# Patient Record
Sex: Male | Born: 1970 | Hispanic: No | Marital: Single | Smoking: Former smoker
Health system: Southern US, Community
[De-identification: ages and names within clinical notes are randomized; demographics above are authoritative.]

## PROBLEM LIST (undated history)

## (undated) DIAGNOSIS — E7800 Pure hypercholesterolemia, unspecified: Secondary | ICD-10-CM

## (undated) DIAGNOSIS — F32A Depression, unspecified: Secondary | ICD-10-CM

## (undated) DIAGNOSIS — K219 Gastro-esophageal reflux disease without esophagitis: Secondary | ICD-10-CM

## (undated) DIAGNOSIS — I10 Essential (primary) hypertension: Secondary | ICD-10-CM

## (undated) DIAGNOSIS — R51 Headache: Secondary | ICD-10-CM

## (undated) DIAGNOSIS — T7840XA Allergy, unspecified, initial encounter: Secondary | ICD-10-CM

## (undated) DIAGNOSIS — F329 Major depressive disorder, single episode, unspecified: Secondary | ICD-10-CM

## (undated) DIAGNOSIS — R519 Headache, unspecified: Secondary | ICD-10-CM

## (undated) DIAGNOSIS — F419 Anxiety disorder, unspecified: Secondary | ICD-10-CM

## (undated) DIAGNOSIS — F209 Schizophrenia, unspecified: Secondary | ICD-10-CM

## (undated) DIAGNOSIS — N200 Calculus of kidney: Secondary | ICD-10-CM

## (undated) DIAGNOSIS — E119 Type 2 diabetes mellitus without complications: Secondary | ICD-10-CM

## (undated) HISTORY — PX: CYSTOSCOPY W/ URETEROSCOPY W/ LITHOTRIPSY: SUR380

## (undated) HISTORY — PX: CIRCUMCISION: SUR203

## (undated) HISTORY — DX: Gastro-esophageal reflux disease without esophagitis: K21.9

## (undated) HISTORY — DX: Anxiety disorder, unspecified: F41.9

## (undated) HISTORY — DX: Allergy, unspecified, initial encounter: T78.40XA

---

## 2006-01-01 ENCOUNTER — Emergency Department (HOSPITAL_COMMUNITY): Admission: EM | Admit: 2006-01-01 | Discharge: 2006-01-01 | Payer: Self-pay | Admitting: Emergency Medicine

## 2006-01-08 ENCOUNTER — Emergency Department (HOSPITAL_COMMUNITY): Admission: EM | Admit: 2006-01-08 | Discharge: 2006-01-09 | Payer: Self-pay | Admitting: Emergency Medicine

## 2006-01-16 ENCOUNTER — Inpatient Hospital Stay (HOSPITAL_COMMUNITY): Admission: EM | Admit: 2006-01-16 | Discharge: 2006-01-19 | Payer: Self-pay | Admitting: Emergency Medicine

## 2006-02-03 ENCOUNTER — Emergency Department (HOSPITAL_COMMUNITY): Admission: EM | Admit: 2006-02-03 | Discharge: 2006-02-03 | Payer: Self-pay | Admitting: Family Medicine

## 2006-02-23 ENCOUNTER — Emergency Department (HOSPITAL_COMMUNITY): Admission: EM | Admit: 2006-02-23 | Discharge: 2006-02-23 | Payer: Self-pay | Admitting: Emergency Medicine

## 2006-04-12 ENCOUNTER — Emergency Department (HOSPITAL_COMMUNITY): Admission: EM | Admit: 2006-04-12 | Discharge: 2006-04-12 | Payer: Self-pay | Admitting: Emergency Medicine

## 2006-09-11 ENCOUNTER — Emergency Department (HOSPITAL_COMMUNITY): Admission: EM | Admit: 2006-09-11 | Discharge: 2006-09-11 | Payer: Self-pay | Admitting: Emergency Medicine

## 2006-09-30 ENCOUNTER — Ambulatory Visit: Payer: Self-pay | Admitting: Family Medicine

## 2006-09-30 ENCOUNTER — Inpatient Hospital Stay (HOSPITAL_COMMUNITY): Admission: EM | Admit: 2006-09-30 | Discharge: 2006-10-01 | Payer: Self-pay | Admitting: Emergency Medicine

## 2006-11-24 ENCOUNTER — Emergency Department (HOSPITAL_COMMUNITY): Admission: EM | Admit: 2006-11-24 | Discharge: 2006-11-24 | Payer: Self-pay | Admitting: Emergency Medicine

## 2008-05-24 ENCOUNTER — Inpatient Hospital Stay (HOSPITAL_COMMUNITY): Admission: EM | Admit: 2008-05-24 | Discharge: 2008-05-28 | Payer: Self-pay | Admitting: Emergency Medicine

## 2008-05-24 ENCOUNTER — Encounter (INDEPENDENT_AMBULATORY_CARE_PROVIDER_SITE_OTHER): Payer: Self-pay | Admitting: General Surgery

## 2008-08-30 ENCOUNTER — Inpatient Hospital Stay (HOSPITAL_COMMUNITY): Admission: EM | Admit: 2008-08-30 | Discharge: 2008-09-03 | Payer: Self-pay | Admitting: Emergency Medicine

## 2008-09-02 ENCOUNTER — Ambulatory Visit: Payer: Self-pay | Admitting: *Deleted

## 2008-09-03 ENCOUNTER — Inpatient Hospital Stay (HOSPITAL_COMMUNITY): Admission: RE | Admit: 2008-09-03 | Discharge: 2008-09-06 | Payer: Self-pay | Admitting: *Deleted

## 2008-09-20 ENCOUNTER — Emergency Department (HOSPITAL_COMMUNITY): Admission: EM | Admit: 2008-09-20 | Discharge: 2008-09-20 | Payer: Self-pay | Admitting: Emergency Medicine

## 2008-11-08 ENCOUNTER — Emergency Department (HOSPITAL_COMMUNITY): Admission: EM | Admit: 2008-11-08 | Discharge: 2008-11-08 | Payer: Self-pay | Admitting: Family Medicine

## 2008-11-12 ENCOUNTER — Emergency Department (HOSPITAL_COMMUNITY): Admission: EM | Admit: 2008-11-12 | Discharge: 2008-11-12 | Payer: Self-pay | Admitting: Emergency Medicine

## 2008-11-23 ENCOUNTER — Other Ambulatory Visit: Payer: Self-pay

## 2008-11-23 ENCOUNTER — Other Ambulatory Visit: Payer: Self-pay | Admitting: Emergency Medicine

## 2008-11-23 ENCOUNTER — Inpatient Hospital Stay (HOSPITAL_COMMUNITY): Admission: RE | Admit: 2008-11-23 | Discharge: 2008-11-26 | Payer: Self-pay | Admitting: *Deleted

## 2008-11-23 ENCOUNTER — Ambulatory Visit: Payer: Self-pay | Admitting: *Deleted

## 2009-03-10 ENCOUNTER — Emergency Department (HOSPITAL_COMMUNITY): Admission: EM | Admit: 2009-03-10 | Discharge: 2009-03-11 | Payer: Self-pay | Admitting: Emergency Medicine

## 2009-05-12 ENCOUNTER — Emergency Department (HOSPITAL_COMMUNITY): Admission: EM | Admit: 2009-05-12 | Discharge: 2009-05-12 | Payer: Self-pay | Admitting: Emergency Medicine

## 2009-10-19 ENCOUNTER — Emergency Department (HOSPITAL_COMMUNITY): Admission: EM | Admit: 2009-10-19 | Discharge: 2009-10-19 | Payer: Self-pay | Admitting: Emergency Medicine

## 2009-12-16 ENCOUNTER — Emergency Department (HOSPITAL_COMMUNITY): Admission: EM | Admit: 2009-12-16 | Discharge: 2009-12-16 | Payer: Self-pay | Admitting: Emergency Medicine

## 2010-01-18 IMAGING — CT CT PELVIS W/ CM
2 of 3 series · 16 of 38 positions shown, 18 images · IV contrast (APPLIED)
Comparison: None

CT ABDOMEN

CLINICAL DATA: 37-year-old with abdominal pain.

CT ABDOMEN AND PELVIS WITH CONTRAST
TECHNIQUE: Multidetector CT imaging of the abdomen and pelvis was
performed using the standard protocol following bolus
administration of intravenous contrast.
Contrast: 125 ml Umnipaque-4LL

[Series 4: abd_pel 5.0 b60f lung · axial · 0.93mm/px · z∈[-82,+24]mm · 13 of 24 slices shown, 15 images]
[im 2/24  soft-tissue]
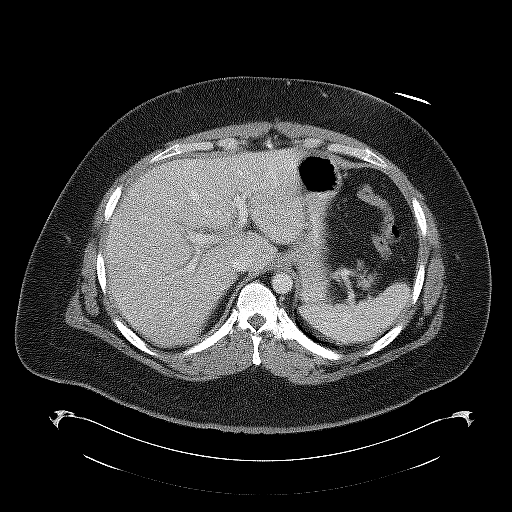
[im 2/24  bone]
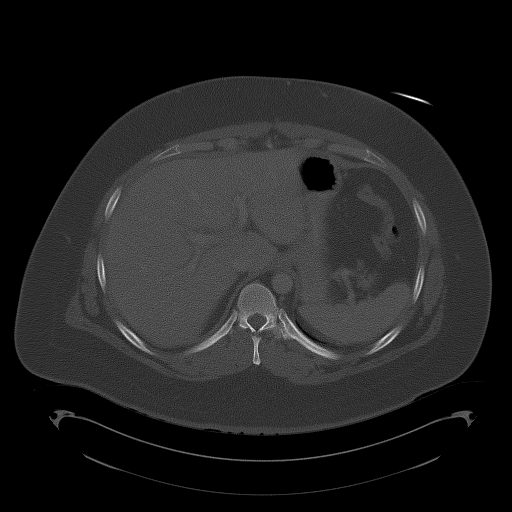
[im 4/24  soft-tissue]
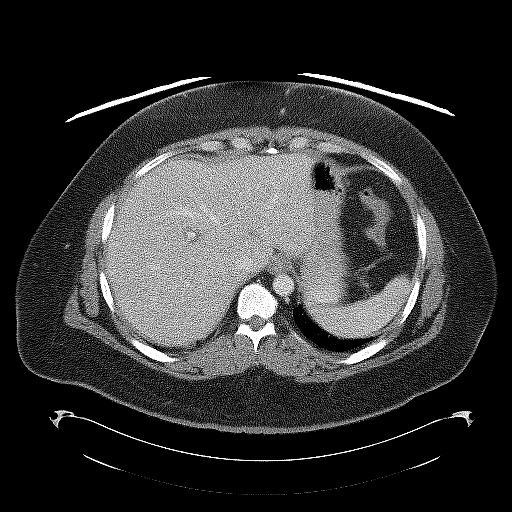
[im 6/24  soft-tissue]
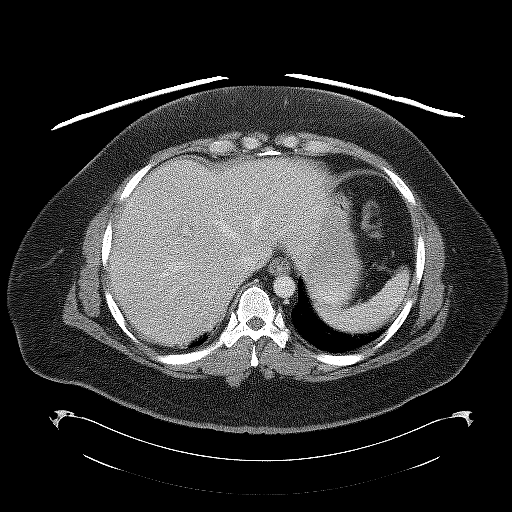
[im 7/24  soft-tissue]
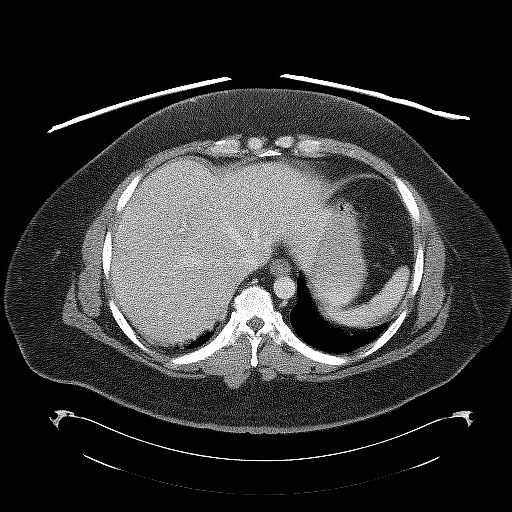
[im 9/24  soft-tissue]
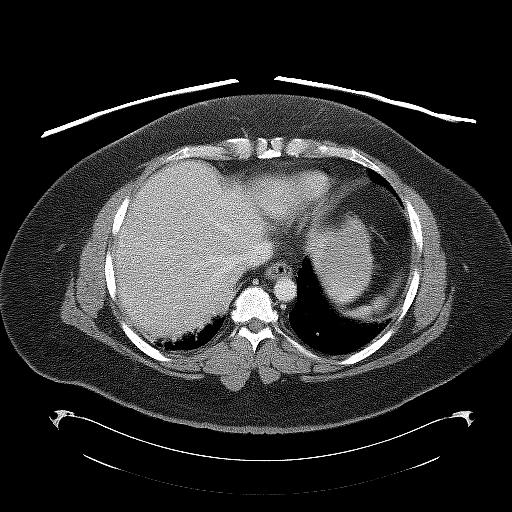
[im 11/24  soft-tissue]
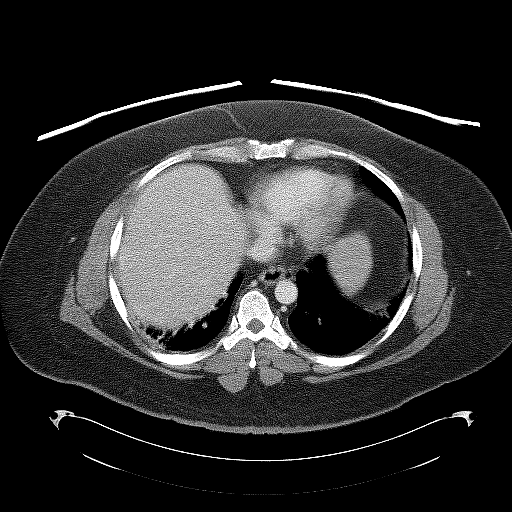
[im 13/24  soft-tissue]
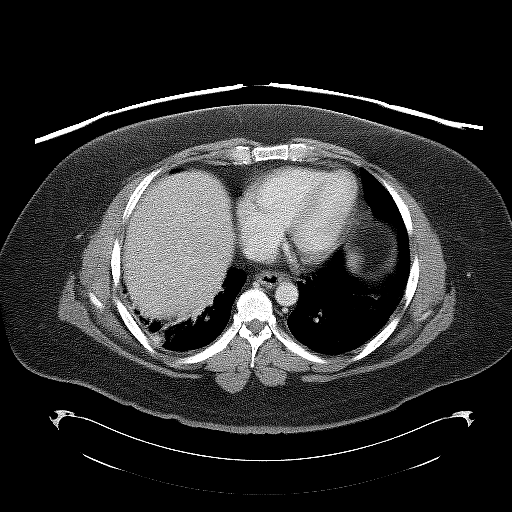
[im 14/24  soft-tissue]
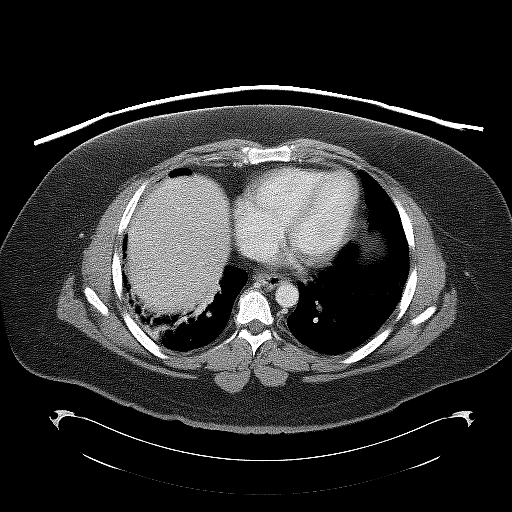
[im 16/24  soft-tissue]
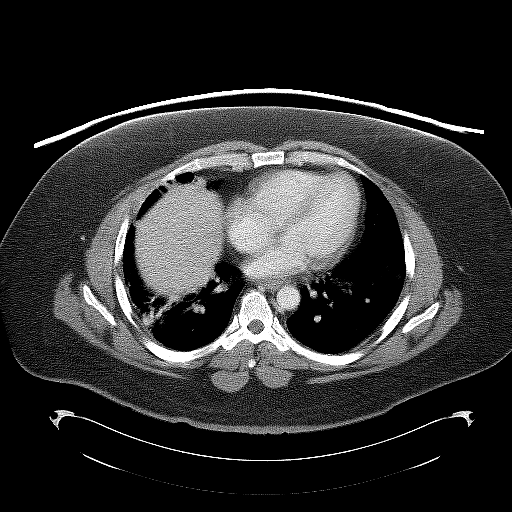
[im 16/24  bone]
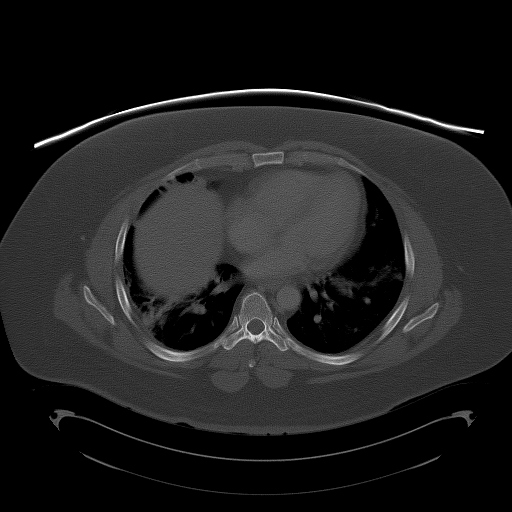
[im 18/24  soft-tissue]
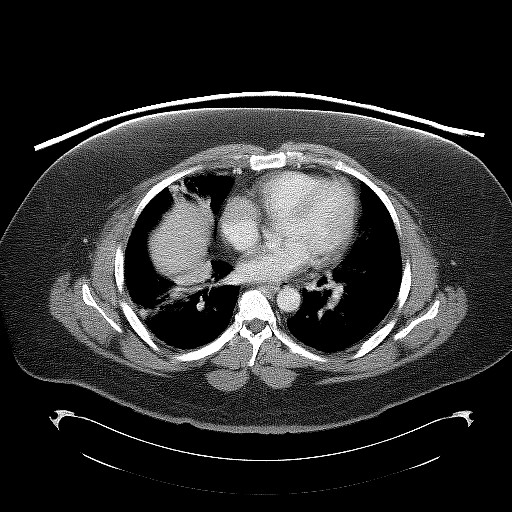
[im 19/24  soft-tissue]
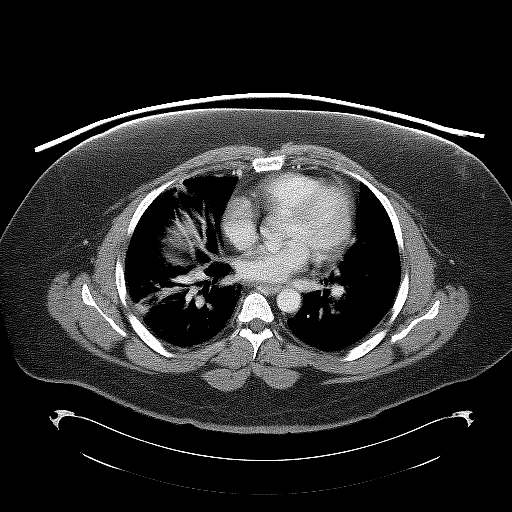
[im 21/24  soft-tissue]
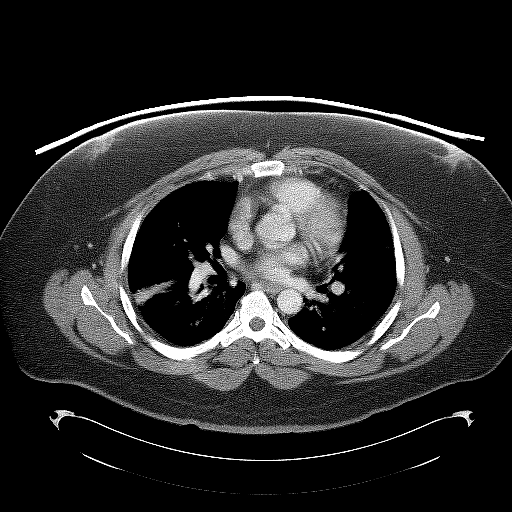
[im 23/24  soft-tissue]
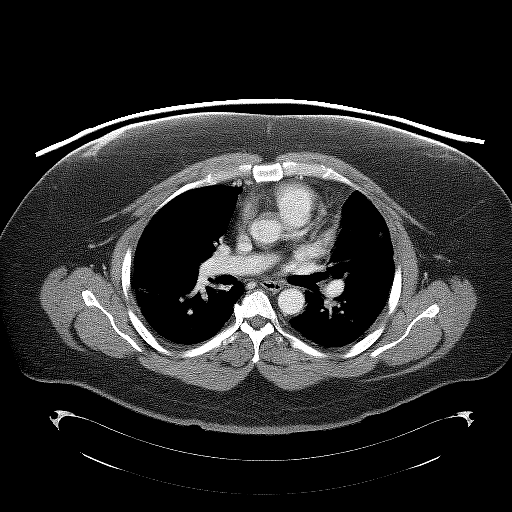

[Series 602: coronal · coronal · 1.07mm/px · 3 of 79 slices shown]
[im 27/79  soft-tissue]
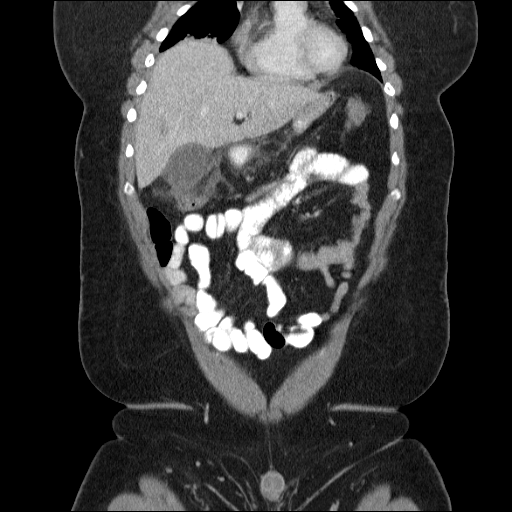
[im 35/79  soft-tissue]
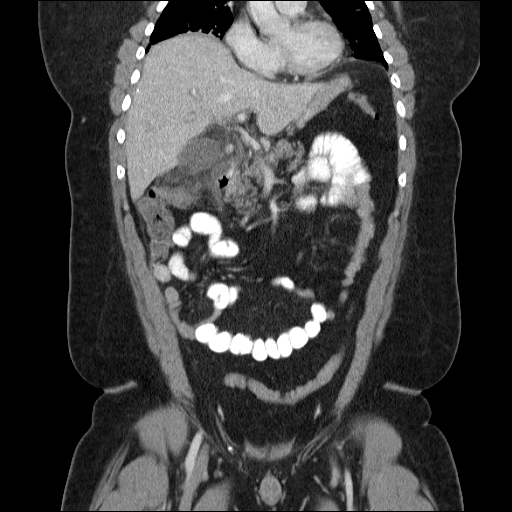
[im 44/79  soft-tissue]
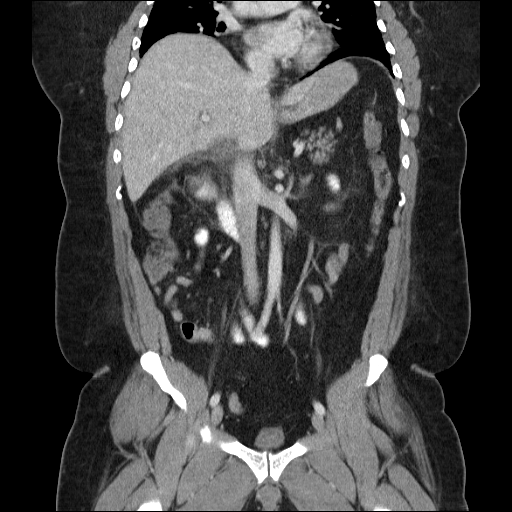

[16 of 38 positions shown; findings below may reference images not displayed]

FINDINGS: There is a 4 mm nodule in the left lower lobe on
sequence #4, image 10.   There is extensive subsegmental
atelectasis in the lung bases.  No evidence for free
intraperitoneal air.  There is extensive stranding and inflammatory
changes centered around the gallbladder and right hepatic flexure
of the colon.  Gallbladder appears to be mildly distended.  There
is evidence for periportal edema in the liver.  The portal venous
system is patent.  There is a normal appearance of the appendix.
No gross abnormalities to the pancreas although there is
inflammation around the descending portion of the duodenum probably
related to the adjacent gallbladder or colon.  There is a right
renal cortical cyst measuring up to 2.5 cm.  Normal appearance of
the adrenal glands, left kidney, spleen and stomach.  No
significant abdominal lymphadenopathy.  Small periumbilical hernia
without complicating features.  No acute bony abnormalities. There
is a 1 cm vague low density structure in the posterior right
hepatic lobe on series #2, image 22 which is too small to
definitively characterize.
IMPRESSION: Inflammatory changes centered around the gallbladder and hepatic
flexure of the colon.  Favor acute cholecystitis.  This may be
further evaluated with a right upper quadrant ultrasound.

Right renal cyst and indeterminate low density structure in the
posterior right hepatic lobe which is too small to definitively
characterize.

4 mm nodule in the left lower lobe. If the patient is at high risk
for bronchogenic carcinoma, follow-up chest CT at 1 year is
recommended.  If the patient is at low risk, no follow-up is
needed.  This recommendation follows the consensus statement:
"Guidelines for Management of Small Pulmonary Nodules Detected on
CT Scans:  A Statement from the [HOSPITAL]" as published in
[URL]

CT PELVIS
FINDINGS: Normal appearance of the prostate, seminal vesicles and
urinary bladder.  No significant free fluid or lymphadenopathy in
the pelvis.  There is a fat containing right inguinal hernia
without complicating features.  No acute bony abnormality
IMPRESSION: No acute pelvic findings.

## 2010-01-19 IMAGING — US US ABDOMEN COMPLETE
1 series · 13 of 25 positions shown · non-contrast
Comparison: CT 05/23/2008

CLINICAL DATA: Infection/inflammation; abnormal CT scan.

ABDOMEN ULTRASOUND
TECHNIQUE: Complete abdominal ultrasound examination was performed
including evaluation of the liver, gallbladder, bile ducts,
pancreas, kidneys, spleen, IVC, and abdominal aorta.

[Series 1: us abdomen complete · 0.30mm/px · 13 of 67 slices shown]
[im 1/67]
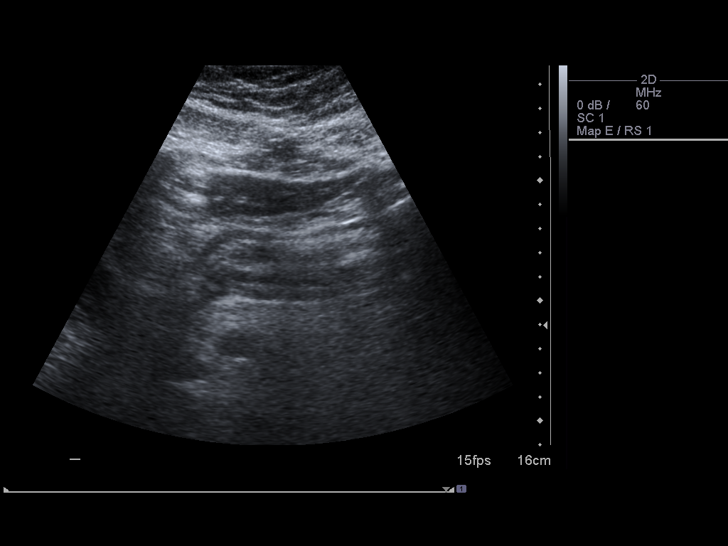
[im 6/67]
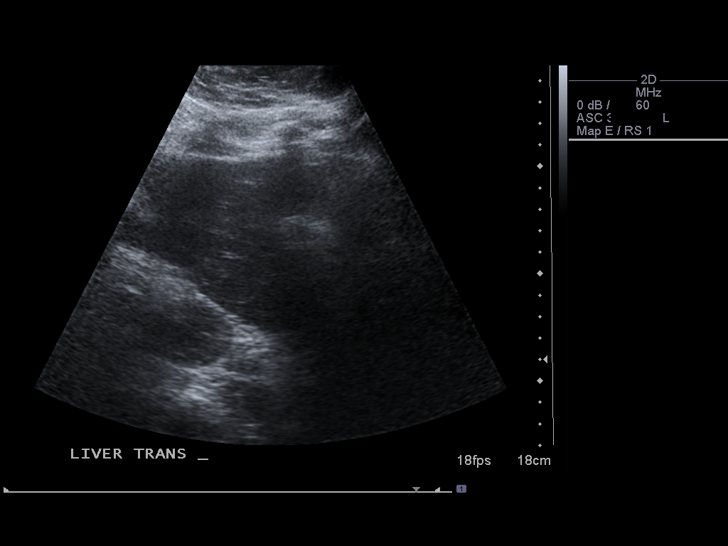
[im 12/67]
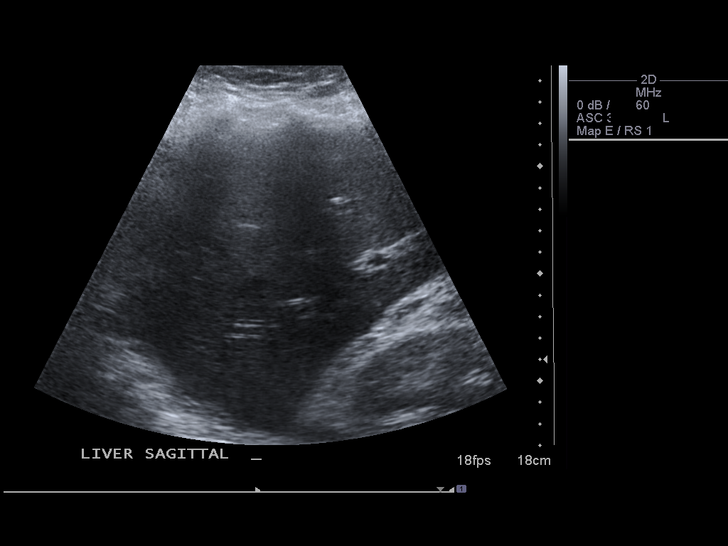
[im 17/67]
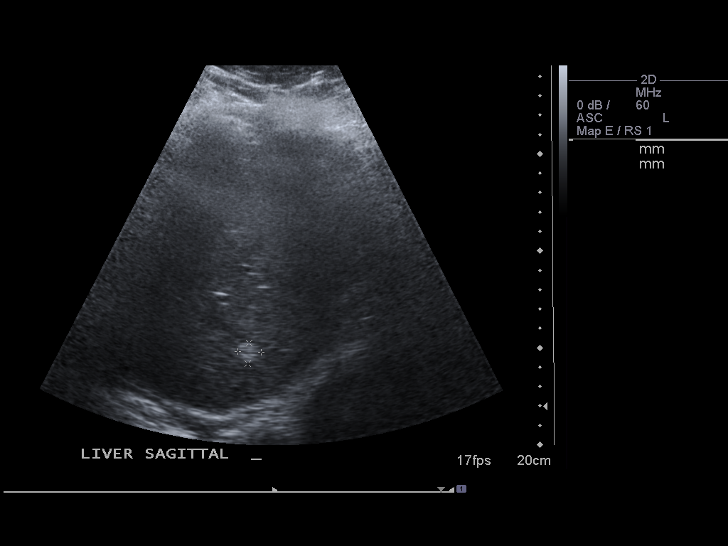
[im 23/67]
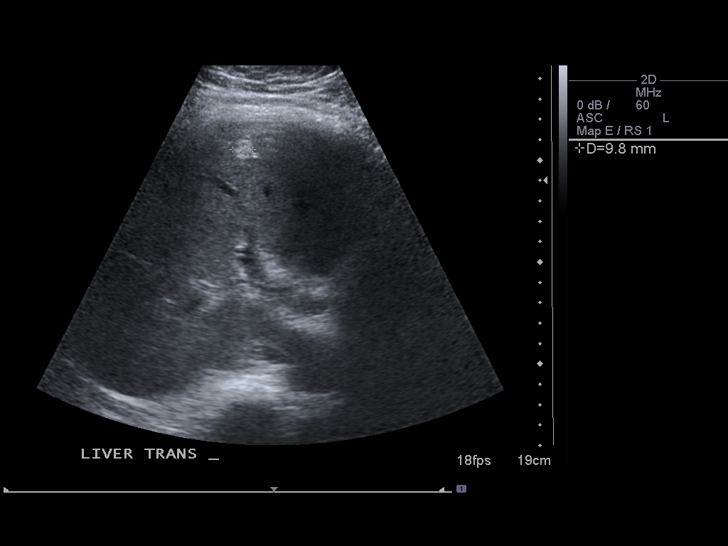
[im 28/67]
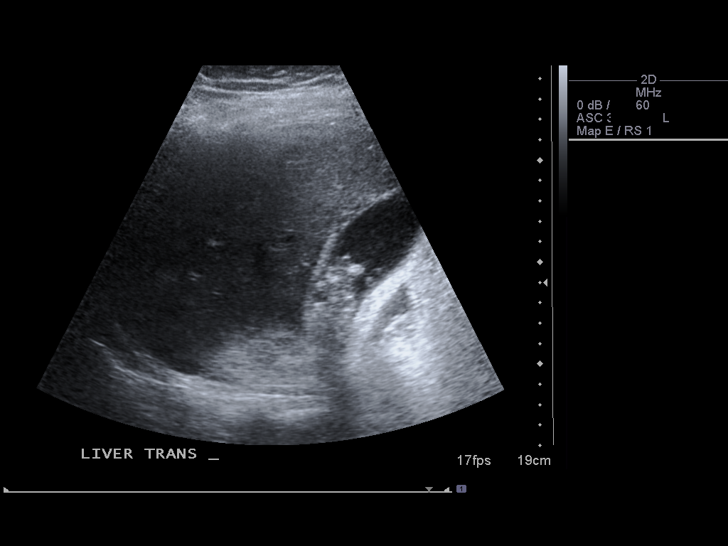
[im 34/67]
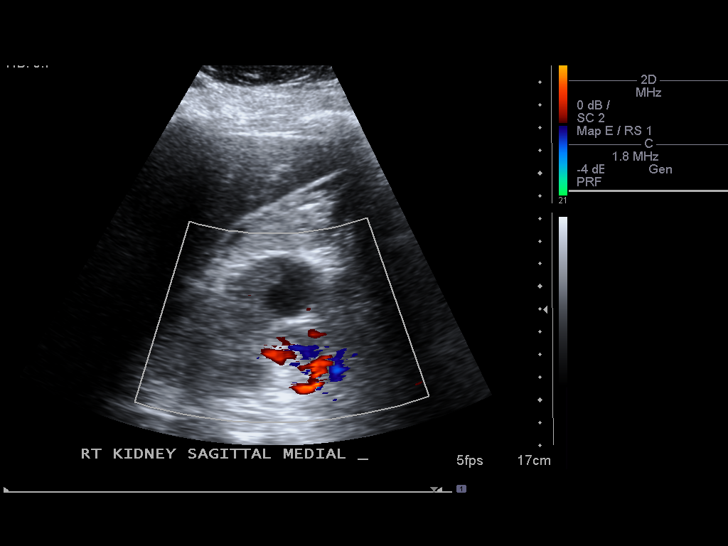
[im 39/67]
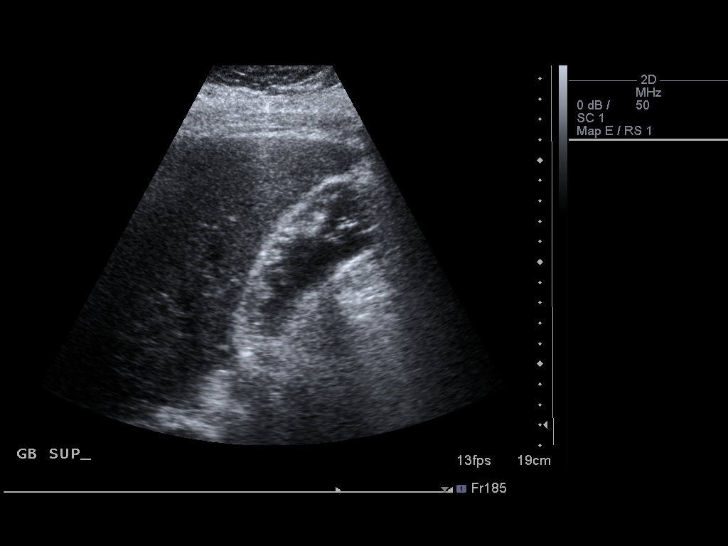
[im 45/67]
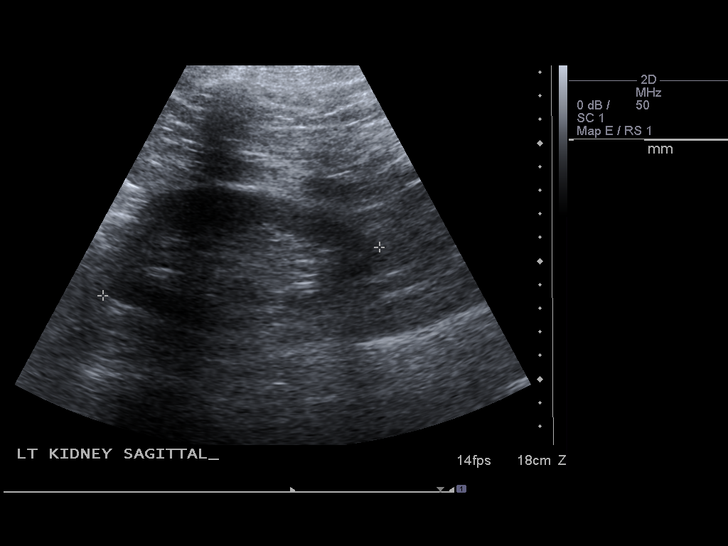
[im 50/67]
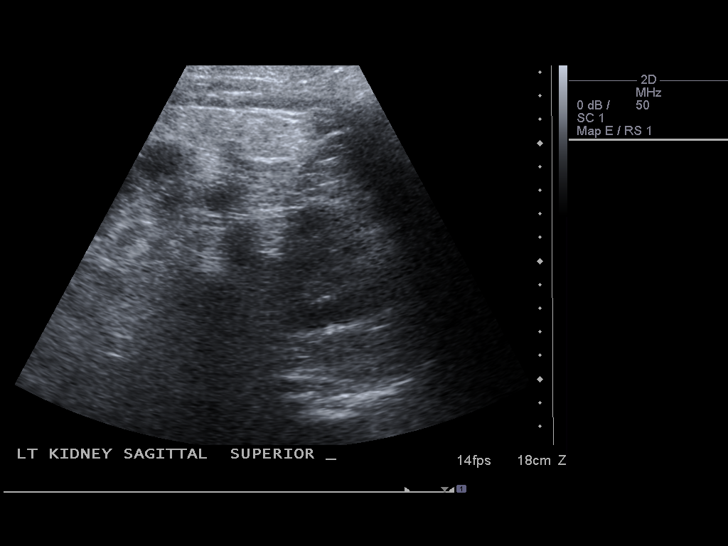
[im 56/67]
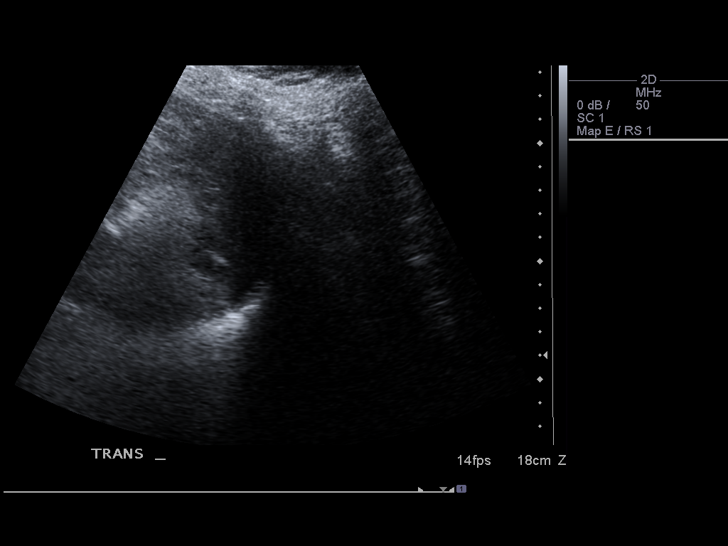
[im 61/67]
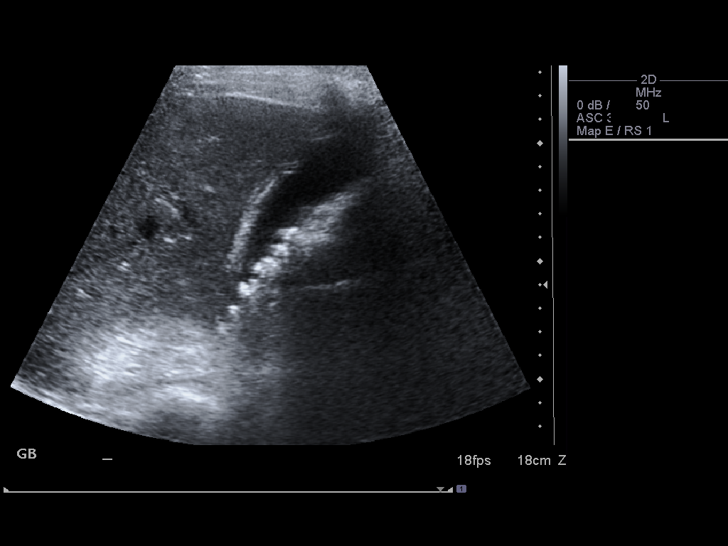
[im 67/67]
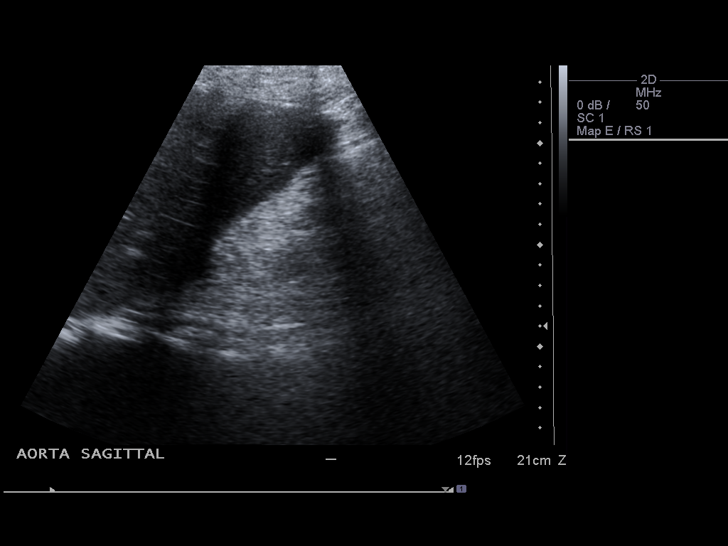

[13 of 25 positions shown; findings below may reference images not displayed]

FINDINGS: Multiple gallstones.  One gallstones measures 9 mm in
diameter.  Gallbladder wall thickening to 7 mm.  Slight free
pericholecystic fluid.  Negative ultrasound Murphy's sign.  Common
duct measures 3.8 mm in diameter, i.e. there is no biliary duct
dilatation.  Two hyperechoic liver lesions both measuring
approximate 1.1 cm, most consistent with cavernous hemangiomas.
Patent IVC.  Abdominal aorta maximum diameter is 1.9 cm.  Limited
visualization of the pancreas due to adjacent bowel gas.  Spleen
measures 5.5 cm in length and appears unremarkable.  2.4 x 2.7 x
2.4 cm cyst in the midpole region of the right kidney.  The right
and left kidneys measure 12.8 cm and 11.9 cm in length,
respectively.
IMPRESSION: Ultrasound findings particularly along with the CT findings are
consistent with acute cholecystitis.  No biliary ductal dilatation.
Cavernous hemangiomas of the liver.  Right renal cyst.

## 2010-04-27 ENCOUNTER — Ambulatory Visit (HOSPITAL_BASED_OUTPATIENT_CLINIC_OR_DEPARTMENT_OTHER): Payer: Medicare Other | Attending: Internal Medicine

## 2010-04-27 DIAGNOSIS — G4730 Sleep apnea, unspecified: Secondary | ICD-10-CM | POA: Insufficient documentation

## 2010-04-27 DIAGNOSIS — G4710 Hypersomnia, unspecified: Secondary | ICD-10-CM | POA: Insufficient documentation

## 2010-05-06 DIAGNOSIS — G4730 Sleep apnea, unspecified: Secondary | ICD-10-CM

## 2010-05-06 DIAGNOSIS — G4710 Hypersomnia, unspecified: Secondary | ICD-10-CM

## 2010-05-16 ENCOUNTER — Emergency Department (HOSPITAL_COMMUNITY): Payer: Medicare Other

## 2010-05-16 ENCOUNTER — Emergency Department (HOSPITAL_COMMUNITY)
Admission: EM | Admit: 2010-05-16 | Discharge: 2010-05-16 | Disposition: A | Discharge status: Home / Self Care | Payer: Medicare Other | Attending: Emergency Medicine | Admitting: Emergency Medicine

## 2010-05-16 DIAGNOSIS — E7800 Pure hypercholesterolemia, unspecified: Secondary | ICD-10-CM | POA: Insufficient documentation

## 2010-05-16 DIAGNOSIS — K219 Gastro-esophageal reflux disease without esophagitis: Secondary | ICD-10-CM | POA: Insufficient documentation

## 2010-05-16 DIAGNOSIS — I10 Essential (primary) hypertension: Secondary | ICD-10-CM | POA: Insufficient documentation

## 2010-05-16 DIAGNOSIS — R0789 Other chest pain: Secondary | ICD-10-CM | POA: Insufficient documentation

## 2010-05-16 DIAGNOSIS — F603 Borderline personality disorder: Secondary | ICD-10-CM | POA: Insufficient documentation

## 2010-05-16 DIAGNOSIS — F209 Schizophrenia, unspecified: Secondary | ICD-10-CM | POA: Insufficient documentation

## 2010-05-16 LAB — URINALYSIS, ROUTINE W REFLEX MICROSCOPIC
Nitrite: NEGATIVE
Specific Gravity, Urine: 1.035 — ABNORMAL HIGH (ref 1.005–1.030)
Urine Glucose, Fasting: NEGATIVE mg/dL
pH: 6 (ref 5.0–8.0)

## 2010-05-16 LAB — POCT CARDIAC MARKERS
CKMB, poc: 2 ng/mL (ref 1.0–8.0)
Myoglobin, poc: 166 ng/mL (ref 12–200)
Troponin i, poc: <0.05 ng/mL (ref 0.00–0.09)
Troponin i, poc: <0.05 ng/mL (ref 0.00–0.09)

## 2010-05-16 LAB — POCT I-STAT, CHEM 8
BUN: 16 mg/dL (ref 6–23)
Calcium, Ion: 1.08 mmol/L — ABNORMAL LOW (ref 1.12–1.32)
Chloride: 108 mEq/L (ref 96–112)
Creatinine, Ser: 1.2 mg/dL (ref 0.4–1.5)
TCO2: 26 mmol/L (ref 0–100)

## 2010-05-16 LAB — CBC
Hemoglobin: 14.2 g/dL (ref 13.0–17.0)
MCH: 28.5 pg (ref 26.0–34.0)
MCHC: 33.6 g/dL (ref 30.0–36.0)
MCV: 84.7 fL (ref 78.0–100.0)
RBC: 4.98 MIL/uL (ref 4.22–5.81)

## 2010-05-16 LAB — DIFFERENTIAL
Basophils Relative: 1 % (ref 0–1)
Eosinophils Absolute: 0.3 10*3/uL (ref 0.0–0.7)
Lymphs Abs: 2.4 10*3/uL (ref 0.7–4.0)
Monocytes Absolute: 0.5 10*3/uL (ref 0.1–1.0)
Monocytes Relative: 7 % (ref 3–12)
Neutro Abs: 4.9 10*3/uL (ref 1.7–7.7)
Neutrophils Relative %: 61 % (ref 43–77)

## 2010-06-08 LAB — CBC
HCT: 41.5 % (ref 39.0–52.0)
Hemoglobin: 14.1 g/dL (ref 13.0–17.0)
MCH: 28.8 pg (ref 26.0–34.0)
MCV: 84.9 fL (ref 78.0–100.0)
RBC: 4.89 MIL/uL (ref 4.22–5.81)

## 2010-06-08 LAB — DIFFERENTIAL
Basophils Relative: 1 % (ref 0–1)
Eosinophils Relative: 3 % (ref 0–5)
Monocytes Absolute: 0.8 10*3/uL (ref 0.1–1.0)
Neutro Abs: 4.8 10*3/uL (ref 1.7–7.7)
Neutrophils Relative %: 58 % (ref 43–77)

## 2010-06-08 LAB — URINALYSIS, ROUTINE W REFLEX MICROSCOPIC
Bilirubin Urine: NEGATIVE
Glucose, UA: NEGATIVE mg/dL
Hgb urine dipstick: NEGATIVE
Ketones, ur: NEGATIVE mg/dL
Nitrite: NEGATIVE
Specific Gravity, Urine: 1.025 (ref 1.005–1.030)
pH: 5 (ref 5.0–8.0)

## 2010-06-08 LAB — POCT I-STAT, CHEM 8
BUN: 15 mg/dL (ref 6–23)
Creatinine, Ser: 1 mg/dL (ref 0.4–1.5)
Glucose, Bld: 117 mg/dL — ABNORMAL HIGH (ref 70–99)
Potassium: 4.1 mEq/L (ref 3.5–5.1)
Sodium: 141 mEq/L (ref 135–145)
TCO2: 25 mmol/L (ref 0–100)

## 2010-06-08 LAB — POCT CARDIAC MARKERS
CKMB, poc: <1 ng/mL — ABNORMAL LOW (ref 1.0–8.0)
Myoglobin, poc: 109 ng/mL (ref 12–200)

## 2010-06-10 LAB — POCT CARDIAC MARKERS
Myoglobin, poc: 106 ng/mL (ref 12–200)
Troponin i, poc: <0.05 ng/mL (ref 0.00–0.09)

## 2010-06-10 LAB — BASIC METABOLIC PANEL
Calcium: 8.6 mg/dL (ref 8.4–10.5)
GFR calc Af Amer: >60 mL/min (ref >60)
GFR calc non Af Amer: >60 mL/min (ref >60)
Sodium: 142 mEq/L (ref 135–145)

## 2010-06-10 LAB — DIFFERENTIAL
Lymphocytes Relative: 45 % (ref 12–46)
Lymphs Abs: 3.6 10*3/uL (ref 0.7–4.0)
Monocytes Relative: 8 % (ref 3–12)
Neutro Abs: 3.5 10*3/uL (ref 1.7–7.7)
Neutrophils Relative %: 43 % (ref 43–77)

## 2010-06-10 LAB — CBC
Hemoglobin: 13.6 g/dL (ref 13.0–17.0)
RBC: 4.64 MIL/uL (ref 4.22–5.81)

## 2010-06-20 ENCOUNTER — Ambulatory Visit (HOSPITAL_BASED_OUTPATIENT_CLINIC_OR_DEPARTMENT_OTHER): Payer: Medicare Other | Attending: Family Medicine

## 2010-06-20 DIAGNOSIS — G4710 Hypersomnia, unspecified: Secondary | ICD-10-CM | POA: Insufficient documentation

## 2010-06-20 DIAGNOSIS — G4730 Sleep apnea, unspecified: Secondary | ICD-10-CM | POA: Insufficient documentation

## 2010-06-24 DIAGNOSIS — G4710 Hypersomnia, unspecified: Secondary | ICD-10-CM

## 2010-06-24 DIAGNOSIS — G4730 Sleep apnea, unspecified: Secondary | ICD-10-CM

## 2010-06-26 LAB — POCT CARDIAC MARKERS
CKMB, poc: 2.8 ng/mL (ref 1.0–8.0)
Myoglobin, poc: 199 ng/mL (ref 12–200)

## 2010-06-26 LAB — URINALYSIS, ROUTINE W REFLEX MICROSCOPIC
Hgb urine dipstick: NEGATIVE
Ketones, ur: NEGATIVE mg/dL
Protein, ur: NEGATIVE mg/dL
Urobilinogen, UA: 0.2 mg/dL (ref 0.0–1.0)

## 2010-06-26 LAB — BASIC METABOLIC PANEL
BUN: 16 mg/dL (ref 6–23)
Chloride: 106 mEq/L (ref 96–112)
Glucose, Bld: 144 mg/dL — ABNORMAL HIGH (ref 70–99)
Potassium: 3.4 mEq/L — ABNORMAL LOW (ref 3.5–5.1)

## 2010-06-26 LAB — PROTIME-INR: Prothrombin Time: 13.2 seconds (ref 11.6–15.2)

## 2010-06-26 LAB — D-DIMER, QUANTITATIVE: D-Dimer, Quant: 0.51 ug/mL-FEU — ABNORMAL HIGH (ref 0.00–0.48)

## 2010-07-01 LAB — RAPID URINE DRUG SCREEN, HOSP PERFORMED
Amphetamines: NOT DETECTED
Benzodiazepines: NOT DETECTED
Tetrahydrocannabinol: NOT DETECTED

## 2010-07-01 LAB — DIFFERENTIAL
Eosinophils Absolute: 0.1 10*3/uL (ref 0.0–0.7)
Eosinophils Relative: 1 % (ref 0–5)
Lymphs Abs: 1.9 10*3/uL (ref 0.7–4.0)
Monocytes Absolute: 0.4 10*3/uL (ref 0.1–1.0)
Monocytes Relative: 6 % (ref 3–12)

## 2010-07-01 LAB — POCT I-STAT, CHEM 8
BUN: 16 mg/dL (ref 6–23)
Creatinine, Ser: 1.2 mg/dL (ref 0.4–1.5)
Glucose, Bld: 97 mg/dL (ref 70–99)
Hemoglobin: 15 g/dL (ref 13.0–17.0)
TCO2: 22 mmol/L (ref 0–100)

## 2010-07-01 LAB — CBC
HCT: 41.8 % (ref 39.0–52.0)
Hemoglobin: 14.1 g/dL (ref 13.0–17.0)
MCV: 85.1 fL (ref 78.0–100.0)
RBC: 4.91 MIL/uL (ref 4.22–5.81)
WBC: 7.2 10*3/uL (ref 4.0–10.5)

## 2010-07-03 LAB — COMPREHENSIVE METABOLIC PANEL
ALT: 19 U/L (ref 0–53)
AST: 21 U/L (ref 0–37)
Alkaline Phosphatase: 73 U/L (ref 39–117)
CO2: 26 mEq/L (ref 19–32)
Calcium: 9 mg/dL (ref 8.4–10.5)
GFR calc Af Amer: >60 mL/min (ref >60)
Glucose, Bld: 113 mg/dL — ABNORMAL HIGH (ref 70–99)
Potassium: 3.6 mEq/L (ref 3.5–5.1)
Sodium: 143 mEq/L (ref 135–145)
Total Protein: 6.5 g/dL (ref 6.0–8.3)

## 2010-07-03 LAB — D-DIMER, QUANTITATIVE: D-Dimer, Quant: 0.64 ug/mL-FEU — ABNORMAL HIGH (ref 0.00–0.48)

## 2010-07-03 LAB — DIFFERENTIAL
Basophils Relative: 1 % (ref 0–1)
Basophils Relative: 0 % (ref 0–1)
Eosinophils Absolute: 0.2 10*3/uL (ref 0.0–0.7)
Eosinophils Absolute: 0 10*3/uL (ref 0.0–0.7)
Eosinophils Relative: 3 % (ref 0–5)
Eosinophils Relative: 0 % (ref 0–5)
Lymphs Abs: 2.9 10*3/uL (ref 0.7–4.0)
Lymphs Abs: 2.5 10*3/uL (ref 0.7–4.0)
Monocytes Relative: 7 % (ref 3–12)
Neutrophils Relative %: 51 % (ref 43–77)

## 2010-07-03 LAB — CBC
HCT: 39.6 % (ref 39.0–52.0)
HCT: 42.9 % (ref 39.0–52.0)
Hemoglobin: 14.1 g/dL (ref 13.0–17.0)
Hemoglobin: 13.4 g/dL (ref 13.0–17.0)
MCHC: 33.7 g/dL (ref 30.0–36.0)
MCHC: 34.1 g/dL (ref 30.0–36.0)
MCV: 84.6 fL (ref 78.0–100.0)
Platelets: 258 10*3/uL (ref 150–400)
RBC: 4.93 MIL/uL (ref 4.22–5.81)
RDW: 13.8 % (ref 11.5–15.5)
RDW: 13.9 % (ref 11.5–15.5)
RDW: 14 % (ref 11.5–15.5)
WBC: 10.6 10*3/uL — ABNORMAL HIGH (ref 4.0–10.5)

## 2010-07-03 LAB — BASIC METABOLIC PANEL
BUN: 25 mg/dL — ABNORMAL HIGH (ref 6–23)
BUN: 7 mg/dL (ref 6–23)
CO2: 23 mEq/L (ref 19–32)
Calcium: 8.2 mg/dL — ABNORMAL LOW (ref 8.4–10.5)
Chloride: 108 mEq/L (ref 96–112)
Chloride: 110 mEq/L (ref 96–112)
Chloride: 110 mEq/L (ref 96–112)
Creatinine, Ser: 1.04 mg/dL (ref 0.4–1.5)
GFR calc Af Amer: >60 mL/min (ref >60)
GFR calc Af Amer: >60 mL/min (ref >60)
GFR calc non Af Amer: >60 mL/min (ref >60)
GFR calc non Af Amer: >60 mL/min (ref >60)
Glucose, Bld: 104 mg/dL — ABNORMAL HIGH (ref 70–99)
Potassium: 3.3 mEq/L — ABNORMAL LOW (ref 3.5–5.1)
Potassium: 3.7 mEq/L (ref 3.5–5.1)
Sodium: 142 mEq/L (ref 135–145)

## 2010-07-03 LAB — POCT CARDIAC MARKERS
Myoglobin, poc: 86.8 ng/mL (ref 12–200)
Troponin i, poc: <0.05 ng/mL (ref 0.00–0.09)

## 2010-07-03 LAB — RAPID URINE DRUG SCREEN, HOSP PERFORMED
Amphetamines: NOT DETECTED
Barbiturates: NOT DETECTED
Benzodiazepines: NOT DETECTED
Cocaine: NOT DETECTED
Opiates: NOT DETECTED

## 2010-07-03 LAB — CK TOTAL AND CKMB (NOT AT ARMC): Total CK: 8488 U/L — ABNORMAL HIGH (ref 7–232)

## 2010-07-03 LAB — MAGNESIUM
Magnesium: 2.2 mg/dL (ref 1.5–2.5)
Magnesium: 2.4 mg/dL (ref 1.5–2.5)

## 2010-07-03 LAB — PHOSPHORUS
Phosphorus: 2.8 mg/dL (ref 2.3–4.6)
Phosphorus: 3 mg/dL (ref 2.3–4.6)

## 2010-07-03 LAB — CARDIAC PANEL(CRET KIN+CKTOT+MB+TROPI)
Relative Index: 0.4 (ref 0.0–2.5)
Troponin I: 0.02 ng/mL (ref 0.00–0.06)

## 2010-07-03 LAB — LIPID PANEL
LDL Cholesterol: 149 mg/dL — ABNORMAL HIGH (ref 0–99)
VLDL: 33 mg/dL (ref 0–40)

## 2010-07-03 LAB — CK: Total CK: 2629 U/L — ABNORMAL HIGH (ref 7–232)

## 2010-07-06 LAB — CBC
HCT: 35.3 % — ABNORMAL LOW (ref 39.0–52.0)
HCT: 39.3 % (ref 39.0–52.0)
Hemoglobin: 13.2 g/dL (ref 13.0–17.0)
MCHC: 32.7 g/dL (ref 30.0–36.0)
MCHC: 33.6 g/dL (ref 30.0–36.0)
MCV: 88 fL (ref 78.0–100.0)
MCV: 88.8 fL (ref 78.0–100.0)
Platelets: 202 10*3/uL (ref 150–400)
Platelets: 253 10*3/uL (ref 150–400)
RBC: 4.47 MIL/uL (ref 4.22–5.81)
RDW: 13.1 % (ref 11.5–15.5)
RDW: 13.1 % (ref 11.5–15.5)
WBC: 18.6 10*3/uL — ABNORMAL HIGH (ref 4.0–10.5)

## 2010-07-06 LAB — COMPREHENSIVE METABOLIC PANEL
ALT: 42 U/L (ref 0–53)
AST: 44 U/L — ABNORMAL HIGH (ref 0–37)
Albumin: 2.8 g/dL — ABNORMAL LOW (ref 3.5–5.2)
Alkaline Phosphatase: 101 U/L (ref 39–117)
BUN: 11 mg/dL (ref 6–23)
CO2: 26 mEq/L (ref 19–32)
Calcium: 7.8 mg/dL — ABNORMAL LOW (ref 8.4–10.5)
Chloride: 99 mEq/L (ref 96–112)
Creatinine, Ser: 1.12 mg/dL (ref 0.4–1.5)
GFR calc Af Amer: >60 mL/min (ref >60)
GFR calc non Af Amer: >60 mL/min (ref >60)
Glucose, Bld: 207 mg/dL — ABNORMAL HIGH (ref 70–99)
Potassium: 3.4 mEq/L — ABNORMAL LOW (ref 3.5–5.1)
Sodium: 131 mEq/L — ABNORMAL LOW (ref 135–145)
Total Bilirubin: 2.3 mg/dL — ABNORMAL HIGH (ref 0.3–1.2)
Total Protein: 6.1 g/dL (ref 6.0–8.3)

## 2010-07-06 LAB — GLUCOSE, CAPILLARY
Glucose-Capillary: 102 mg/dL — ABNORMAL HIGH (ref 70–99)
Glucose-Capillary: 109 mg/dL — ABNORMAL HIGH (ref 70–99)
Glucose-Capillary: 114 mg/dL — ABNORMAL HIGH (ref 70–99)
Glucose-Capillary: 94 mg/dL (ref 70–99)

## 2010-07-11 LAB — URINALYSIS, ROUTINE W REFLEX MICROSCOPIC
Glucose, UA: 100 mg/dL — AB
Hgb urine dipstick: NEGATIVE
Leukocytes, UA: NEGATIVE
Specific Gravity, Urine: 1.029 (ref 1.005–1.030)
Urobilinogen, UA: 1 mg/dL (ref 0.0–1.0)

## 2010-07-11 LAB — URINE MICROSCOPIC-ADD ON

## 2010-07-11 LAB — COMPREHENSIVE METABOLIC PANEL
Albumin: 3.4 g/dL — ABNORMAL LOW (ref 3.5–5.2)
Alkaline Phosphatase: 101 U/L (ref 39–117)
BUN: 13 mg/dL (ref 6–23)
Calcium: 8.9 mg/dL (ref 8.4–10.5)
Glucose, Bld: 169 mg/dL — ABNORMAL HIGH (ref 70–99)
Potassium: 3.6 mEq/L (ref 3.5–5.1)
Total Protein: 7.1 g/dL (ref 6.0–8.3)

## 2010-07-11 LAB — DIFFERENTIAL
Basophils Relative: 0 % (ref 0–1)
Lymphocytes Relative: 4 % — ABNORMAL LOW (ref 12–46)
Lymphs Abs: 0.7 10*3/uL (ref 0.7–4.0)
Monocytes Absolute: 0.9 10*3/uL (ref 0.1–1.0)
Monocytes Relative: 5 % (ref 3–12)
Neutro Abs: 16.8 10*3/uL — ABNORMAL HIGH (ref 1.7–7.7)
Neutrophils Relative %: 91 % — ABNORMAL HIGH (ref 43–77)

## 2010-07-11 LAB — CBC
HCT: 42.9 % (ref 39.0–52.0)
MCHC: 33.4 g/dL (ref 30.0–36.0)
Platelets: 261 10*3/uL (ref 150–400)
RDW: 13.1 % (ref 11.5–15.5)

## 2010-08-08 NOTE — Op Note (Signed)
[name], [name]               ACCOUNT NO.:  [id number]   MEDICAL RECORD NO.:  [id number]          PATIENT TYPE:  INP   LOCATION:  1506                         FACILITY:  [facility]   PHYSICIAN:  Adolph Pollack, M.D.DATE OF BIRTH:  02-13-1971   DATE OF PROCEDURE:  05/24/2008  DATE OF DISCHARGE:                               OPERATIVE REPORT   PREOPERATIVE DIAGNOSIS:  Acute cholecystitis.   POSTOPERATIVE DIAGNOSIS:  Gangrenous cholecystitis.   PROCEDURE:  Laparoscopic cholecystectomy with operative cholangiogram.   SURGEON:  Adolph Pollack, M.D.   ASSISTANT:  Ardeth Sportsman, M.D.   ANESTHESIA:  General.   INDICATIONS:  This 40 year old male was admitted last night with a right  upper quadrant inflammatory process by CT.  Ultrasound verified that the  epicenter was the gallbladder with cholelithiasis and gallbladder wall  thickening.  He is now brought to the operating room for  cholecystectomy.   TECHNIQUE:  He is brought to the operating room, placed supine on the  operating table and general anesthetic was administered.  The hair on  the abdominal wall was clipped and the area sterilely prepped and  draped.  The patient has a known small supraumbilical hernia present.  I  injected Marcaine solution in the supraumbilical region and made a  transverse supraumbilical incision through the skin and subcutaneous  tissue and identified the hernia and went through the hernia sac to gain  access to the peritoneal cavity.  A Hassan trocar was introduced to the  peritoneal cavity.  Pneumoperitoneum created by insufflation of CO2 gas.   Laparoscope was introduced.  There were adhesions between the omentum  and gallbladder noted.  The patient was placed in reverse Trendelenburg  position, the right side tilted up.  The 11 mm trocars placed through an  epigastric incision and two 5 mm trocars placed through the right upper  quadrant.  There were some adhesions between the right  colon and the  gallbladder and these were taken down using careful blunt dissection.  Adhesions between the omentum of the gallbladder were taken down by  blunt dissection.  Gangrenous changes were noted.  The gallbladder  underwent needle decompression with purulent-appearing bile evacuated.   Following this the fundus of the gallbladder was grasped using blunt  dissection.  The infundibulum was identified.  The infundibulum was then  mobilized using blunt dissection and hydrodissection on the gallbladder.  Infundibulum was then retracted laterally and identified the cystic  duct.  A window was created around the cystic duct and a critical view  achieved.   A clip was placed on the cystic duct gallbladder junction and a small  incision made in the cystic duct with reflux some bile.  A cholangiocath  was passed through the anterior abdominal wall,  placed into the cystic  duct and cholangiogram was performed.   Under real time fluoroscopy, dilute contrast was injected into the  cystic duct which was small and a moderate length.  Common hepatic,  right mid hepatic, common ducts all filled promptly and contrast drained  promptly to the duodenum without obvious evidence of obstruction.  The cholangiocatheter was removed, the cystic duct was clipped four  times on the biliary side and divided.  I then used careful blunt  dissection to identify the cystic duct lymph node and underlying  anterior branch of the cystic artery which was clipped and divided.  A  posterior branch of the cystic artery was also identified, clipped and  divided.  Following this, I then dissected the gallbladder free from  liver using electrocautery.  There was a  fair amount of bleeding  because of acute inflammatory response.  There was leakage of some of  the gallbladder contents as well.  Gallbladder was placed in the  Endopouch bag and removed through the subumbilical port.   A subumbilical trocar was  replaced.  I then copiously irrigated out the  gallbladder fossa, evacuated clots with suction.  I then found the  bleeding points and controlled these with electrocautery.  I then used  about 4 liters of irrigation to irrigate out the perihepatic area and  evacuated fluid.  I have inspected the gallbladder fossa  multiple times  and noted no further bleeding and no bile.  Surgicel was then placed in  the gallbladder fossa.   I then inserted a drain into the abdominal cavity and placed it in the  gallbladder fossa.  The drain then exited the abdominal cavity through  the lateral right sided trocar site.  It was anchored to the skin with  nylon suture.  Following this, I evacuated the irrigation fluid as much  as possible.   I then removed the supraumbilical trocar and under laparoscopic vision  repaired the hernia defect with two simple 0 Vicryl sutures.  I then  removed the remaining trocars and released pneumoperitoneum.   The remaining skin incisions were closed with 4-0 Monocryl subcuticular  stitches.  The drain was placed to closed suction.  Steri-Strips and  sterile dressings were applied.  He tolerated the procedure without any  apparent complications and was taken to recovery in satisfactory  condition.      Adolph Pollack, M.D.  Electronically Signed     TJR/MEDQ  D:  05/24/2008  T:  05/25/2008  Job:  [id number]

## 2010-08-08 NOTE — Consult Note (Signed)
NAMEDAVIDMICHAEL, ZARAZUA               ACCOUNT NO.:  [id number]   MEDICAL RECORD NO.:  [id number]          PATIENT TYPE:  INP   LOCATION:  4742                         FACILITY:  [facility]   PHYSICIAN:  Antonietta Breach, M.D.  DATE OF BIRTH:  [birth year]   DATE OF CONSULTATION:  08/30/2008  DATE OF DISCHARGE:                                 CONSULTATION   REFERRING PHYSICIAN:  [facility].   REASON FOR CONSULTATION:  Depression and psychosis.   HISTORY OF PRESENT ILLNESS:  Mr. [name] is a 40 year old male  admitted to the [facility] on August 29, 2008 with chest pain.   Mr. [name] states that he has been undergoing a lot of stress on his job.  He has redeveloped auditory hallucinations.  They are telling him to  kill himself.  He reports that he had been more anxious.   He states that he has been taking his Risperdal dosage regularly by  mouth and that he also has continued to receive a Risperdal injection in  the county [facility].   This return of symptoms has been occurring for approximately 2 days.   Mr. [name] admitting physician noted that he was having auditory  hallucinations at the time of admission.  The patient has been residing  at the [facility].   PAST PSYCHIATRIC HISTORY:  Mr. [name] does have a history of attempting  suicide under the influence of command self-destructive auditory  hallucinations.   In review of the past medical record, he attempted suicide in October  2007.  At that time he overdosed on Wellbutrin and trazodone in order to  kill himself.  He had been taking Prozac, Risperdal, trazodone and  Wellbutrin.  He was hearing voices telling him to kill himself at that  time.   He also has been admitted to [facility] in this time.  He has  had several suicide attempts.   FAMILY PSYCHIATRIC HISTORY:  None known.   SOCIAL HISTORY:  He has been residing at an assisted-living facility.  He does  not use alcohol or illegal drugs.   Mr. [name] is single.   PAST MEDICAL HISTORY:  Hyperlipidemia, gastroesophageal reflux disease.   ALLERGIES:  No known drug allergies.   MEDICATIONS:  His MAR is reviewed.  He is on Risperdal and bupropion.   LABORATORY DATA:  WBC 10.6, hemoglobin 14.6, platelet count 258,000.   His alcohol level was negative.  Drug screen negative.  Sodium 142, BUN  25, creatinine 2.02, glucose 104.   REVIEW OF SYSTEMS:  Constitutional, head, eyes, ears, nose, throat,  mouth, neurologic, psychiatric, cardiovascular, respiratory,  gastrointestinal, genitourinary, skin, hematologic, lymphatic,  endocrine, and metabolic are all unremarkable.  Musculoskeletal:  Please  see the creatinine above.  He also had a total CK of 8488.  He has been  assessed to have rhabdomyolysis.   EXAMINATION:  VITAL SIGNS:  Temperature 97.9, pulse 87, respiratory rate  20, blood pressure 102/68, O2 saturation on room air 92%.  GENERAL APPEARANCE:  Mr. [name] is a middle-aged male lying in a left  lateral  decubitus position in his hospital bed with no abnormal  involuntary movements.   MENTAL STATUS EXAM:  Mr. [name] is alert.  His eye contact is good.  His  affect is anxious.  His mood is anxious.  His concentration is mildly  decreased.  He is oriented to all spheres.  His memory is intact to  immediate recent and remote.  His fund of knowledge and intelligence are  within normal limits.  His speech involves normal rate and prosody  without dysarthria.  Thought process is coherent.  Thought content  please see the history of present illness.  Insight is partial.  Judgment is impaired.   ASSESSMENT:  AXIS I:  1. 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  2. 293.83 mood disorder not otherwise specified, depressed.  3. 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  AXIS II:  Deferred.  AXIS III:  See past medical history.  AXIS IV:  General  medical.  AXIS V:  20.   Mr. [name] is at risk for suicide.   RECOMMENDATIONS:  1. Would admit to an inpatient psychiatric unit for further evaluation      and treatment.  2. Would continue his Risperdal and monitor for any stiffness or other      extrapyramidal side effects.  3. Would keep in mind the Risperdal can prolong the QTC given that Mr.      [name] is being evaluated for his cardiac status.  4. An antipsychotic less likely to prolong the QTC is Zyprexa.  5. Would continue low stimulation ego support.      Antonietta Breach, M.D.  Electronically Signed     JW/MEDQ  D:  08/30/2008  T:  08/30/2008  Job:  [id number]

## 2010-08-08 NOTE — Discharge Summary (Signed)
NAMEBURLE, KWAN               ACCOUNT NO.:  [id number]   MEDICAL RECORD NO.:  [id number]          PATIENT TYPE:  INP   LOCATION:  5123                         FACILITY:  [facility]   PHYSICIAN:  Wayne A. Sheffield Slider, M.D.    DATE OF BIRTH:  1970/06/26   DATE OF ADMISSION:  09/29/2006  DATE OF DISCHARGE:  10/01/2006                               DISCHARGE SUMMARY   PRIMARY CARE PHYSICIAN:  He is seen by the [facility] for his  psychiatric care.   DISCHARGE DIAGNOSIS:  Schizophrenia.   DISCHARGE MEDICATIONS:  Are:  1. Wellbutrin 150 mg p.o. daily.  2. Risperdal 5 mg p.o. b.i.d.  3. Trazodone 150 mg p.o. q.h.s.   CONSULTS:  None.   PROCEDURE:  None.   LABS:  Were done on admission, his urine drug screen was negative.  His  blood alcohol level was undectable.  A UA was also negative.  An ABG was  within normal limits and his creatinine was found to be 1.3.   BRIEF HOSPITAL COURSE:  Mr. Mena is a 40 year old man who presented to  the ED with complaints of hearing voices, laughing at him and telling  him to hurt himself.  He states that he has hallucinations at baseline,  but that they had worsened the morning of presentation to the hospital.  He does have a known history of schizophrenia and is seen by a  psychiatrist with the [facility].  He states, however, he has a hard  time affording his medications and he had been stretching his  medications prior to this event.  He said he was taking less than the  recommended and less often than required.  He states that his  psychiatrist is working on getting him financial assistance with his  meds, but that has not come through yet.  Upon admission and throughout  his stay, he was found to be medically stable.  Physical exam was  normal.  By the time he was seen by the family practice teaching service  team, he was denying suicidal ideations and hallucinations.  On the  morning of September 30, 2006, he was still denying suicidal ideation and  hallucinations and was feeling much better once back on his medications.  I was able to get a hold of Ambrose Mantle with the [facility].  She was familiar with the patient and knew him well.  She states that  they had been trying to get him financial assistance for his medications  and they had come up with a plan to switch him from Risperdal to [place],  which they had plenty of samples to give him and will change him from  his Wellbutrin to Prozac, since it is on the $4.00 drug list.  They will  be able to do this outpatient.  They did have an appointment available  on October 01, 2006 at noon.  They did state that they would pick the  patient up at 10:30 a.m. from unit [address] of the [facility] and will take care of adjusting his medications as  an  outpatient.  He did receive his morning doses of his antipsychotic  medications prior to discharge.   FOLLOWUP APPOINTMENT:  The patient will be released to his psychiatry  team and will be taken directly to his followup psychiatry appointment.  He was discharged to his psychiatrist in stable medical condition.      Ardeen Garland, MD  Electronically Signed      Arnette Norris. Sheffield Slider, M.D.  Electronically Signed    LM/MEDQ  D:  09/30/2006  T:  09/30/2006  Job:  [id number]

## 2010-08-08 NOTE — H&P (Signed)
[name], [name]               ACCOUNT NO.:  [id number]   MEDICAL RECORD NO.:  [id number]          PATIENT TYPE:  EMS   LOCATION:  ED                           FACILITY:  [facility]   PHYSICIAN:  Adolph Pollack, M.D.DATE OF BIRTH:  [birth year]   DATE OF ADMISSION:  05/23/2008  DATE OF DISCHARGE:                              HISTORY & PHYSICAL   CHIEF COMPLAINT:  Right upper quadrant pain.   HISTORY OF PRESENT ILLNESS:  This 40 year old male awoke this morning he  said with right upper quadrant pain that began as a cramp and  progressively worsened.  He is a resident of [facility] assisted [facility].  Pain got bad enough that subsequently he was brought to the [facility]  emergency department by ambulance and after evaluation was felt to have  acute cholecystitis.  I was asked to see him.  Upon speaking with him,  he states that he had been having some nausea, vomiting.  He denies  having anything like this before.   PAST MEDICAL HISTORY:  1. Schizophrenia.  2. Depression.  3. Hypertension.  4. Gastroesophageal reflux disease.  5. Hypercholesterolemia.   PAST SURGICAL HISTORY:  Circumcision.   ALLERGIES:  None.   MEDICATIONS:  1. Risperdal injection 25 mg every 2 weeks.  2. Risperidone 2 mg p.o. q.h.s.  3. Prilosec 20 mg daily.  4. Patanol ophthalmic solutions one drop in each eye twice a day for      allergies.  5. Lisinopril 20 mg p.o. daily.  6. MiraLax 17 grams in fluid daily.  7. Bupropion SR 150 mg p.o. b.i.d.   SOCIAL HISTORY:  He is disabled because of his psychiatric illness.  He  lives in assisted living in [facility].   FAMILY HISTORY:  Notable for heart disease in his father.   REVIEW OF SYSTEMS:  CARDIOVASCULAR:  He states that as far as he knows  he does not have any heart disease.  PULMONARY:  He denies pneumonia,  COPD or asthma.  GI:  He denies peptic ulcer disease, hepatitis,  hematochezia, melena.  GU: He denies any kidney stones.  Endocrine no  diabetes.  Hematologic he denies any bleeding sores or blood clots.  NEUROLOGIC:  Denies strokes or seizures.   PHYSICAL EXAMINATION:  GENERAL:  An overweight male, appears to be  somewhat uncomfortable.  He is on the stretcher in the hallway.  VITAL SIGNS:  Current temperature is 101.6, pulse 115, blood pressure  172/103, respiratory 20, O2 saturations 89% on room air.  HEENT:  Eyes:  Extraocular motions intact.  No icterus.  NECK:  Supple without masses.  RESPIRATORY:  Breath sounds are shallow, equal and clear.  CARDIOVASCULAR:  Increased rate.  ABDOMEN:  Soft with right upper quadrant tenderness and guarding and a  fullness present.  Obese.  EXTREMITIES:  No cyanosis or edema.  SKIN:  Olive appearing, no obvious jaundice.   LABORATORY DATA:  Notable for a white blood cell count of 18,800,  hemoglobin of 14.4.  Electrolytes within normal limits except for  glucose of 169.  Total bilirubin is 1.4, but the  rest of the liver  function tests are not elevated.  Lipase is normal.   CT scan was performed.  This demonstrates extensive subsegmental  atelectasis in the lung bases.  There is stranding and inflammatory  changes around the gallbladder as well as a right hepatic flexure and  second portion of duodenum.  Gallbladder is mildly distended.  No  mention of gallstones.  There is a small periumbilical hernia, and there  was noted to be a 4 mm small nodule in left lower lobe.  There is also a  small fat containing right inguinal hernia present.   IMPRESSION:  1. Acute right upper quadrant inflammatory process, most likely acute      cholecystitis versus right-sided colitis.  This will need to be      clarified.  2. Schizophrenia.  3. Hypoxia, most likely secondary to splinting and atelectasis.  4. Hypertension.   PLAN:  Will admit him to the hospital.  Will obtain an ultrasound of the  right upper quadrant to further define the inflammatory process and make  sure it is  cholecystitis.  Will start him on IV antibiotics and restart  most of his medications.  Will place him on oxygen and incentive  spirometry.  If ultrasound confirms acute cholecystitis, I have  discussed with him a laparoscopic possible open cholecystectomy.  I went  over the procedure and risks with him.  Risks including bleeding,  infection, wound healing problems, anesthesia, bile leak, injury to the  common bile duct/liver/intestine, and postcholecystectomy diarrhea.  He  seems to understand all this.  He asks questions and answers questions  appropriately.  He agrees with the plan.      Adolph Pollack, M.D.  Electronically Signed     TJR/MEDQ  D:  05/24/2008  T:  05/24/2008  Job:  [id number]

## 2010-08-08 NOTE — H&P (Signed)
[name], [name]               ACCOUNT NO.:  [id number]   MEDICAL RECORD NO.:  [id number]          PATIENT TYPE:  INP   LOCATION:  4742                         FACILITY:  [facility]   PHYSICIAN:  Della Goo, M.D. DATE OF BIRTH:  [birth year]   DATE OF ADMISSION:  08/29/2008  DATE OF DISCHARGE:                              HISTORY & PHYSICAL   PRIMARY CARE PHYSICIAN:  Unassigned.   CHIEF COMPLAINT:  Chest pain and hearing voices.   HISTORY OF PRESENT ILLNESS:  This is a 40 year old male with a history  of schizophrenia who resides at the [facility] facility  who was brought to the emergency department secondary to complaints of  severe chest pain.  The patient states the pain began at about 8 p.m.  He describes having sharp right-sided chest pain associated with  shortness of breath, nausea but no vomiting.  He denies having any  diaphoresis.  He rated the pain as being 7/10 at the worst.  He states  the pain lasted about 30 minutes.   The patient also reports that he has been hearing voices since he  arrived in the emergency department. He states these voices were telling  him to hurt himself.   PAST MEDICAL HISTORY:  Significant for:  1. Schizophrenia.  2. Hyperlipidemia.  3. Gastroesophageal reflux disease.  4. Depression.   MEDICATIONS:  Risperdal, omeprazole, lisinopril, hydroxyzine, and  bupropion.  He is also on Patanol ophthalmic drops, Tylenol p.r.n. and  MiraLax p.r.n.   ALLERGIES:  No known drug allergies.   SOCIAL HISTORY:  The patient resides at the [facility]  facility.  He has a history of smoking.  He reports quitting 3 months  ago.  He previously had smoked one-half of a pack of cigarettes daily.  He is a nondrinker and he denies any illicit drug usage.   FAMILY HISTORY:  Positive for coronary artery disease in his father.  He  also reports his father had a heart murmur.  He denies any hypertension,  diabetes or cancer  in his family.  He does report his mother had high  cholesterol.   REVIEW OF SYSTEMS:  Pertinents are mentioned above.  All other organ  systems are negative.   PHYSICAL EXAMINATION:  FINDINGS:  This is a 40 year old obese male  currently in no visible discomfort or acute distress.  VITAL SIGNS:  Temperature 97.4, blood pressure 104/72, heart rate  initially 125 now it is 92, respirations 20, O2 saturations 97%.  HEENT: Normocephalic, atraumatic.  Pupils equally round, reactive to  light.  Extraocular movements are intact. Funduscopic benign.  There is  no scleral icterus.  Nares are patent bilaterally.  Oropharynx is clear.  NECK:  Supple, full range of motion.  No thyromegaly, adenopathy, or  jugular venous distention.  CARDIOVASCULAR:  Initially tachycardiac but now regular rate and rhythm.  No murmurs, gallops or rubs.  LUNGS: Clear to auscultation bilaterally.  ABDOMEN: Positive bowel sounds, soft, nontender, nondistended.  EXTREMITIES: Without cyanosis, clubbing or edema.  NEUROLOGIC EXAMINATION:  The patient is alert and oriented x3.  Cranial  nerves are intact.  Motor and sensory function also intact.   LABORATORY STUDIES:  White blood cell count 10.6, hemoglobin 14.6,  hematocrit 42.9, MCV 84.6, platelets 258, neutrophils 68%, lymphocytes  24%.  Sodium 142, potassium 3.3, chloride 108, carbon dioxide 23, BUN  25, creatinine 2.02 and glucose 104.  Point of care cardiac markers with  a myoglobin of greater than 500, CK-MB 18.6 and troponin less than 0.05.  Cardiac enzymes with a total CK of 8488, CK-MB 35.8 and relative index  0.4.  EKG reveals a normal sinus rhythm without acute ST-segment  changes.  The patient had a urine drug screen performed, which was found  to be negative, and an alcohol level performed which was less than 5.   ASSESSMENT:  A 40 year old male being admitted with:  1. Chest pain.  2. Rhabdomyolysis.  3. Psychosis with auditory hallucinations.  4.  Hypokalemia.   PLAN:  The patient will be admitted to a telemetry area for cardiac  monitoring.  Cardiac enzymes will be performed.  The patient will be  placed on pain control and antiemetic therapy as needed.  IV fluids have  also been ordered for hydration secondary to the elevated CPK (creatine  phosphokinase levels).  The CPK levels will be monitored as well.  The  patient's medications will be further verified.  The patient will also  be placed on DVT and GI prophylaxis and potassium replacement therapy  has also been ordered.      Della Goo, M.D.  Electronically Signed     HJ/MEDQ  D:  08/30/2008  T:  08/30/2008  Job:  [id number]

## 2010-08-08 NOTE — Discharge Summary (Signed)
[name], SABOL               ACCOUNT NO.:  [id number]   MEDICAL RECORD NO.:  [id number]          PATIENT TYPE:  INP   LOCATION:  1506                         FACILITY:  [facility]   PHYSICIAN:  Adolph Pollack, M.D.DATE OF BIRTH:  05-05-1970   DATE OF ADMISSION:  05/23/2008  DATE OF DISCHARGE:  05/28/2008                               DISCHARGE SUMMARY   FINAL DISCHARGE DIAGNOSIS:  Acute cholecystitis.   SECONDARY DIAGNOSES:  1. Hypoxia secondary to bibasilar atelectasis.  2. Schizophrenia.  3. Depression.  4. Hypertension.  5. Gastroesophageal reflux disease.  6. Hypercholesterolemia.   PROCEDURE:  Laparoscopic cholecystectomy with intraoperative  cholangiogram on May 24, 2008.   REASON FOR ADMISSION:  This 40 year old male awoke the morning of  admission with right upper quadrant pain that got progressively worse.  He was brought to the [facility] emergency department and after  evaluation was noted to have findings concerning for acute  cholecystitis.  This was by way of CT.  He also had a 4 mm left lower  lung pulmonary nodule and followup CT in 1 year was recommended.  He had  some elevation of his total bilirubin to 1.4.  White blood cell count  was 18,800.  CT scan demonstrated some right upper quadrant inflammatory  process which was felt to possibly be secondary to cholecystitis.  He  was admitted.   HOSPITAL COURSE:  He was admitted, started on IV antibiotics and an  ultrasound was obtained which was consistent with acute cholecystitis  and cholelithiasis.  He started intravenous antibiotics and was taken to  the operating room May 24, 2008, where he had evidence of acute and  gangrenous cholecystitis.  He tolerated the cholecystectomy well and a  drain was left in and he was maintained on intravenous antibiotics.  His  symptoms improved fairly significantly soon after the surgery and he did  not require much of any pain medication.  As for his schizophrenia  this  was controlled with medications which were restarted immediately  postoperatively.  He had a liquid diet started and it was slowly  advanced.  The drain output remained serosanguineous.  He had some  hypoxia with ambulation.  A chest x-ray demonstrated bibasilar  atelectasis.  His O2 saturations improved with aggressive incentive  spirometry and being out of bed to where they were 92% on room air on  the day of discharge.  His diet was advanced and he was tolerating it  and bowels were working.  The wounds looked good and the drain was able  to be removed.  He was switched over to oral Augmentin and was able to  be discharged.   DISPOSITION:  Discharged to [facility] May 28, 2008, in satisfactory  condition.  His diet is low-fat.  His activity is limited to light  activity for 1 week and then resume activities as tolerated.  He may  shower.  He has a dressing in the right upper quadrant area that needs  to be changed daily until the small open wound heals.  It is a dry gauze  dressing.  MEDICATIONS:  1. Tylenol 650 mg p.o. q.4 hours p.r.n. pain.  2. Augmentin 875 mg p.o. q.12 hours x4 days then discontinue.  3. Bupropion SR 150 mg p.o. b.i.d.  4. Lisinopril 20 mg p.o. daily.  5. MiraLax 17 mg in fluid daily p.r.n. constipation.  6. Prilosec 20 mg daily.  7. Risperidone 2 mg p.o. q.h.s.  8. Risperdal injection 25 mg q.2 weeks.  9. Patanol ophthalmic solution 1 drop in each eye twice a day for      allergies.   FOLLOWUP:  He needs to return to my office in 2-3 weeks for followup  visit or for any problems such as high fevers, vomiting or wound  problems.  Please call [phone] for problems or for an appointment.   Of note, is that he will need a followup CT scan in 1 year for this 4 mm  pulmonary nodule in the left lung lower lobe.      Adolph Pollack, M.D.  Electronically Signed     TJR/MEDQ  D:  05/28/2008  T:  05/28/2008  Job:  [id number]

## 2010-08-08 NOTE — H&P (Signed)
[name], [name] NO.:  [id number]   MEDICAL RECORD NO.:  [id number]          PATIENT TYPE:  IPS   LOCATION:  0306                          [facility]  [facility]   PHYSICIAN:  Jasmine Pang, M.D. DATE OF BIRTH:  [birth year]   DATE OF ADMISSION:  09/03/2008  DATE OF DISCHARGE:                       PSYCHIATRIC ADMISSION ASSESSMENT   HISTORY:  The patient presents with a history of hallucinations, telling  him to cut himself.  The patient was initially seen and stabilized on a  medical unit for complaints of chest pain.  The patient had a cardiac  workup.  The patient was chest pain-free and transferred to our  facility.  He states that one of his  significant stressors is that he  is now working, and his boss is pressuring him to work quicker.  He  contracts for safety in our unit at this time.  He reports that his  sleep has been satisfactory and his appetite has been satisfactory, and  is currently on the ACT team.   PAST PSYCHIATRIC HISTORY:  His first admission to the [facility].  He sees Dr. Electa Sniff for outpatient mental health services.  He  has a history of a suicide attempt in the past by cutting his wrists.   SOCIAL HISTORY:  A 40 year old, separated male has been living in [facility] for one year.  He feels content at this facility.   FAMILY HISTORY:  Is unknown.   PAST ALCOHOL AND DRUG HISTORY:  He is a nonsmoker.  Denies any alcohol  or drug use.   PRIMARY CARE Angle Dirusso:  Is unclear.   PAST MEDICAL HISTORY:  1. A history of acid reflux.  2. Hypertension.  3. Atypical chest pain.   MEDICATIONS:  1. He reports getting a Risperdal injection every two weeks, last      given is unknown.  2. The patient was discharged on Wellbutrin 150 mg p.o. b.i.d.  3. Protonix 40 mg daily.  4. Risperdal 2 mg b.i.d.  5. Trazodone 100 mg at bedtime.  6. Tylenol 650 mg q.6 h.  7. Norvasc 2.5 mg daily.   DRUG ALLERGIES:  No known allergies.   PHYSICAL EXAMINATION:  GENERAL:  This is a well-nourished, well-  developed male.  He appears in no distress.  Offers no complaints.  VITAL SIGNS:  Temperature 97.3 degrees, heart rate 75, respirations 18,  blood pressure is 112/63.   LABORATORY DATA:  Shows a CBC that is within normal limits.  Cholesterol  of 210.  TSH of 3.427.  BMET shows a glucose of 110.  Alcohol was less  than 5.   MENTAL STATUS EXAM:  The patient is resting in bed, very cooperative  with good eye contact.  Speech is clear.  The patient's mood is neutral.  The patient again is pleasant, agreeable to our recommendations.  Thought process, he was endorsing hallucinations, but denies any current  and denies any current suicidal thoughts. His memory appears intact.  Judgment and insight are fair.   DIAGNOSES:  AXIS I:  Psychotic disorder, not otherwise specified.  AXIS II:  Deferred.  AXIS III:  1.  Acid reflux.  1. Hypertension.  2. Atypical chest pain.  AXIS IV:  Possible problems with occupation, other psychosocial problems  related to burden of illness.  AXIS V:  Current is 35 to 40.   PLAN:  Is to continue medications.  Will monitor his comorbidities.  Will confirm returning to [facility].  The patient will be placed on the  blue team.   Tentative length of stay is three to five days.      Landry Corporal, N.P.      Jasmine Pang, M.D.  Electronically Signed    JO/MEDQ  D:  09/03/2008  T:  09/03/2008  Job:  [id number]

## 2010-08-08 NOTE — Discharge Summary (Signed)
[name], [name]               ACCOUNT NO.:  [id number]   MEDICAL RECORD NO.:  [id number]          PATIENT TYPE:  INP   LOCATION:  4742                         FACILITY:  [facility]   PHYSICIAN:  Eduard Clos, MDDATE OF BIRTH:  09/16/1970   DATE OF ADMISSION:  08/29/2008  DATE OF DISCHARGE:  09/01/2008                               DISCHARGE SUMMARY   COURSE IN THE HOSPITAL:  A 40 year old male with known history of  schizophrenia, hypertension, was brought in after the patient was having  chest pain from [facility].  Patient was  admitted to medical floor and serial cardiac enzymes and EKGs were done.  The EKG did not show any acute findings.  His CPKs were initially  elevated at 8488 which gradually decreased to 2600 at time of discharge  with hydration.  Patient at this time is chest pain free.   A Psychiatry consult was obtained with Dr. Jeanie Sewer who advised to  continue with Risperdal and transfer to [facility] once  medically stable.   At this time, as patient is largely asymptomatic, we will transfer  patient to [facility] for further management of his psychiatric  issue as patient has had hallucinations.  At the time of this dictation,  patient is hemodynamically stable.   PROCEDURES DONE DURING THIS STAY:  Chest x-ray on August 29, 2008, showed  no acute abnormalities.   PERTINENT LABS:  Troponins were negative.  CPK was initially around 8000  which decreased to 2600 at time of discharge.   FINAL DIAGNOSES:  1. Atypical chest pain, resolved, ACS ruled out.  2. Hypertension.  3. Schizophrenia.  4. Mood disorder.  5. Mild rhabdomyolysis.   MEDICATION AT DISCHARGE:  1. Wellbutrin 150 mg p.o. b.i.d.  2. Protonix 40 mg p.o. daily.  3. Risperdal 2 mg p.o. at bedtime.  4. Trazodone 100 mg p.o. at bedtime.  5. Acetaminophen 650 mg p.o. q.6 p.r.n.  6. Norvasc 2.5 mg p.o. daily.   PLAN:  Patient will be transferred to  [facility] for  further management of his psychiatric issues.  Patient to recheck his  complete metabolic panel and CPK in 3 days.  Patient's activity as  tolerated.      Eduard Clos, MD  Electronically Signed     ANK/MEDQ  D:  09/01/2008  T:  09/01/2008  Job:  [id number]

## 2010-08-08 NOTE — Discharge Summary (Signed)
[name], KOUDELKA NO.:  [id number]   MEDICAL RECORD NO.:  [id number]          PATIENT TYPE:  IPS   LOCATION:  0306                          [facility]ILITY[facility]  [facility]   PHYSICIAN:  Jasmine Pang, M.D. DATE OF BIRTH:  10/05/1970   DATE OF ADMISSION:  09/02/2008  DATE OF DISCHARGE:  09/06/2008                               DISCHARGE SUMMARY   IDENTIFICATION:  This is a 40 year old separated white male, who has  been living in [facility] for 1 year.  He was admitted on a voluntary  basis on September 02, 2008.   HISTORY OF PRESENT ILLNESS:  The patient presents with a history of  hallucinations telling him to cut himself.  He was initially seen and  stabilized on medical unit for complaints of chest pain.  He had a  cardiac workup.  The patient was chest pain-free and transferred to our  facility.  He states that one of his significant stressors is that he is  now working and his boss is pressuring him to work quicker.  He  contracts for safety on our unit at this time.  He reports that his  sleep has been satisfactory and his appetite has been satisfactory, and  he is currently on the ACT team.  This is his first admission to  [facility].  He sees Dr. Electa Sniff for outpatient mental  health services on the ACT team.  He has a history of a suicide attempt  in the past by cutting his wrists.  For further admission information,  see psychiatric admission assessment.   PHYSICAL FINDINGS:  There were no acute physical or medical problems  noted.   DIAGNOSTIC STUDIES:  Shows a CBC is within normal limits.  Cholesterol  of 210.  TSH of 3.427.  BMET shows a glucose of 110.  Alcohol was less  than 5.   HOSPITAL COURSE:  Upon admission, the patient was restarted on his home  medications of Wellbutrin XL 150 mg daily, Risperdal 2 mg p.o. q.h.s.,  trazodone 100 mg p.o. q.h.s., Protonix 40 mg daily, Norvasc 2.5 mg  daily, and Risperdal M-Tab 0.5 mg t.i.d. p.r.n.  agitation or psychosis.  In individual sessions, the patient initially was disheveled with  psychomotor retardation.  Speech was soft and slow.  Mood was depressed  and anxious.  Affect was consistent with mood.  There was positive  suicidal ideation as per history of present illness.  No evidence of a  thought disorder.  He was endorsing auditory hallucinations, voices  telling him to kill himself, and as hospitalization progressed the  patient began to be less depressed and less anxious.  His auditory  hallucinations resolved.  His sleep was good and appetite was good.  He  began to want discharge soon and was planned he would go back to [facility] on September 06, 2008.  On this day,  the patient's sleep was good,  appetite was good.  Mood was euthymic.  Affect consistent with mood.  There was no suicidal or homicidal ideation.  No thoughts of self  injurious behavior.  No auditory or visual hallucinations.  No paranoia  or delusions.  Thoughts were logical and goal-directed.  Thought  content, no predominant theme.  Cognitive was grossly intact.  Insight  good.  Judgment good.  Impulse control good.  It was felt the patient  was safe for discharge today and he was going to return to [facility].  He would also return to his [facility] ACT team for follow up.  The patient had  been working at the [employer], but found this to be very stressful.  He had decided he did not want to return to work there and felt greatly  relieved by this decision.   DISCHARGE DIAGNOSES:  Axis I:  Psychotic disorder, not otherwise  specified.  Axis II:  None.  Axis III:  Acid reflux, hypertension, and atypical chest pain.  Axis IV:  Moderate (problems with occupation, other psychosocial  problems related to burden of illness).  Axis V:  Global assessment of functioning was 50 at the time of  discharge.  GAF was 35-40 upon admission.  GAF highest past year was 55-  60.   DISCHARGE PLANS:  There were no specific  activity level or dietary  restriction.   POSTOP HOSPITAL CARE PLANS:  The patient will be seen by the [facility] ACT  team on September 06, 2008. He will also return to [facility] on September 06, 2008.  He will also talk to his doctor about continuing lisinopril,  hydroxyzine, Patanol eye drops, MiraLax, and omeprazole.   DISCHARGE MEDICATIONS:  1. Wellbutrin XL 150 mg daily.  2. Risperdal 2 mg at bedtime.  3. Trazodone 100 mg at bedtime.  4. Norvasc 2.5 mg daily.  5. Protonix 40 mg daily.      Jasmine Pang, M.D.  Electronically Signed     BHS/MEDQ  D:  09/14/2008  T:  09/15/2008  Job:  [id number]

## 2010-08-08 NOTE — H&P (Signed)
[name], [name] NO.:  [id number]   MEDICAL RECORD NO.:  [id number]          PATIENT TYPE:  IPS   LOCATION:  0304                          FACILITY:  [facility]   PHYSICIAN:  Jasmine Pang, M.D. DATE OF BIRTH:  [birth year]   DATE OF ADMISSION:  11/23/2008  DATE OF DISCHARGE:                       PSYCHIATRIC ADMISSION ASSESSMENT   This is a 40 year old male, voluntarily admitted on November 23, 2008.   HISTORY OF PRESENT ILLNESS:  The patient presented to the emergency  department with complaints of command hallucinations with voices telling  him to cut his wrist.  He started hearing the voices in the past 24  hours.  The patient reports compliance with his medications.  Denies any  specific stressors and no history of any alcohol or substance use.   PAST PSYCHIATRIC HISTORY:  The patient was here in June 2010 for similar  symptoms.  Is a client of the ACT team.   SOCIAL HISTORY:  The patient lives in assisted living at [facility] and  is disabled.   FAMILY HISTORY:  None.   ALCOHOL/DRUG HISTORY:  He is a nonsmoker.  Denies any alcohol or  substance use.   PRIMARY CARE Joleen Stuckert:  Fleet Contras, M.D.   MEDICAL PROBLEMS:  A history of hypertension.   MEDICATION LIST:  Risperdal 2 mg 1 tablet in the morning, 1 tablet in  the afternoon, Wellbutrin 50 mg daily, Norvasc 2.5 mg daily, Vistaril 50  mg 1 p.r.n. for anxiety, trazodone 100 mg q.h.s.   DRUG ALLERGIES:  No known allergies.   PHYSICAL EXAMINATION:  Physical exam was done in the emergency  department.  Physical exam was reviewed.  No significant findings.  Of  note, the patient had old healed scars in his left wrist from prior  cutting.   His laboratory data shows a CBC within normal limits, BMET within normal  limits.  Urine drug screen is negative.  Alcohol level less than 5.   MENTAL STATUS EXAM:  The patient is up and about on the unit,  disheveled, fair eye contact.  He is pleasant and  agreeable. His speech  is clear, but he does offer little other information other than  questions already asked of him.  The patient's mood is neutral.  He is  again pleasant.  Does not appear to be actively responding to internal  stimuli, cognition function intact.  Memory is intact.  Judgment and  insight are fair.   DIAGNOSES:   AXIS I:  Schizophrenia, undifferentiated type.   AXIS II:  Deferred.   AXIS III:  He has no known medical conditions.   AXIS IV:  Psychosocial problems relate to burden of illness.   AXIS V:  Current is 35.   Our plan is to increase Risperdal, will continue with the patient's  other medications.  The patient may be placed on the blue team.  We will  continue to assess comorbidities and identify any stressors.  The  patient is to continue to be medication- compliant and to follow up with  his outpatient mental health treatment.   Tentative length of stay  at this time is 3 to 5 days.      Landry Corporal, N.P.      Jasmine Pang, M.D.  Electronically Signed    JO/MEDQ  D:  11/24/2008  T:  11/24/2008  Job:  [id number]

## 2010-08-08 NOTE — H&P (Signed)
[name], [name]               ACCOUNT NO.:  [id number]   MEDICAL RECORD NO.:  [id number]          PATIENT TYPE:  OBV   LOCATION:  5123                         FACILITY:  [facility]   PHYSICIAN:  Lequita Asal, MD   DATE OF BIRTH:  [birth year]   DATE OF ADMISSION:  09/29/2006  DATE OF DISCHARGE:                              HISTORY & PHYSICAL   ATTENDING PHYSICIAN:  Dr. Tawanna Cooler McDiarmid   CHIEF COMPLAINT:  Hearing voices.   HISTORY OF PRESENT ILLNESS:  The patient is a 40 year old male with a  history of schizophrenia who presents complaining of hearing voices  laughing at him and telling him to hurt himself.  Per patient, it does  seem that he does have some auditory hallucinations at baseline,  however, since this morning, they have worsened.  Patient reports  multiple social stressors at home including financial difficulties and  problems involving custody issues of his son.  When he was first  evaluated in the emergency room, patient was having suicidal ideation  with plan.  Upon our assessment, he is currently denying both suicidal  ideation and homicidal ideation.  The patient reports multiple prior  hospitalizations for similar symptoms.  The patient denies alcohol use  and drug abuse.  The patient also denies tobacco abuse.  The patient  states that he is compliant with all of his psych meds.   PAST MEDICAL HISTORY:  1. Schizophrenia.  2. Gastroesophageal reflux disease.  3. Depression.  4. Suicide attempts in the past.   PAST SURGICAL HISTORY:  History of cardiac cath.   ALLERGIES:  NO KNOWN DRUG ALLERGIES.   MEDICATIONS:  1. Risperdal 5 mg p.o. b.i.d.  2. Trazodone 150 mg p.o. q.h.s.  3. Wellbutrin 150 mg p.o. daily.  4. Protonix.   FAMILY HISTORY:  A grandmother with schizophrenia, and a father with  heart disease.   SOCIAL HISTORY:  The patient lives alone.  The patient is a former  smoker.  The patient denies drug use.  The patient denies alcohol use.   REVIEW OF SYSTEMS:  Per HPI, otherwise negative.   PHYSICAL EXAMINATION:  VITAL SIGNS:  Blood pressure 172/84.  Heart rate  60.  Respiratory rate 24.  Temperature of 98.5.  Oxygen saturation 99%  on room air.  GENERAL:  Patient is in no apparent distress, sitting in the dark.  Pleasant.  HEENT:  Normocephalic, atraumatic.  Pupils are equal, round and reactive  to light.  No scleral icterus.  No rhinorrhea.  Neck supple.  CARDIOVASCULAR:  Regular rate and rhythm.  No murmurs, rubs or gallops.  LUNGS:  Clear to auscultation bilaterally.  No wheezes, rales or  rhonchi.  ABDOMEN:  Soft, nontender, nondistended.  Positive bowel sounds.  EXTREMITIES:  No tenderness noted.  Had 2+ distal pulses.  No edema.  PSYCH/NEURO:  Normal speech.  Alert and oriented.  Not responding to  external stimuli.  Moving all extremities.  Normal affect and mood.   LABORATORY DATA:  Urine drug screen negative, urinalysis normal.  Alcohol level is less than 5 mg/dl.  Sodium 141, potassium 3.6, chloride  105, glucose 101, BUN 11, creatinine 1.3.   ASSESSMENT/PLAN:  The patient is a 40 year old male with a history of  schizophrenia with auditory hallucinations here with suicidal ideation  and worsening auditory hallucinations.   Problem #1 - Schizophrenia:  The patient has been assessed by psychiatry  and ACT team.  He currently has a Comptroller.  He is awaiting bed  availability at [facility].  We will continue to have a sitter for the  patient.  We will continue the patient's home regimen of Risperdal,  trazodone, Wellbutrin.  We will place the patient on 23-hour observation  status and await bed opening at [facility].  Problem #2 - Gastroesophageal reflux disease:  We will start the patient  on Protonix 40 mg p.o. daily.  Problem #3 - Feeding and nutrition:  We will place the patient on  regular diet.  Problem #4 - Prophylaxis:  Ambulation around room.  Protonix 40 mg p.o.  daily.  Problem #5 - Disposition:   Pending bed availability at [facility].  We  will consult the social worker and/or the progression nurse or assistant  in facilitating transfer to [facility].  We will also follow  recommendations with psychiatry in the morning.      Lequita Asal, MD  Electronically Signed     TB/MEDQ  D:  09/30/2006  T:  09/30/2006  Job:  [id number]

## 2010-08-11 NOTE — H&P (Signed)
NAME:  [name], [name] NO.:  [id number]   MEDICAL RECORD NO.:  [id number]          PATIENT TYPE:  EMS   LOCATION:  ED                           FACILITY:  [facility]   PHYSICIAN:  Lucita Ferrara, MD         DATE OF BIRTH:  [birth year]   DATE OF ADMISSION:  01/15/2006  DATE OF DISCHARGE:                                HISTORY & PHYSICAL   HISTORY OF PRESENT ILLNESS:  The patient is a 40 year old male with a past  medical history significant for psychological depression and schizophrenia  and multiple suicide attempts in the past who presents to [facility] with a suicide attempt.  He apparently was severely depressed  and stated, per his own consciousness, took his Wellbutrin and trazodone in  order to hurt himself.  The patient is also on Prozac and Risperdal.  The  patient says that he has had a history of hearing voices, although he has  not heard any voices for the last 4-5 months.  The patient denies any acute  substance abuse.  He denies taking aspirin or Tylenol.  On presentation, the  patient denies any chest pain or shortness of breath, weakness.  He denies  any numbness, tingling, chest pain or abdominal pain.   REVIEW OF SYSTEMS:  Otherwise negative.   Upon presenting to the emergency room, the patient got charcoal, and  acetaminophen and aspirin levels were drawn, in addition to alcohol levels.  All were below the toxic levels.   MEDICATIONS:  The patient is taking:  1. Trazodone 100 mg p.o. daily.  2. Wellbutrin 75 mg p.o. daily.  3. Prozac at unknown doses.  4. Risperdal at unknown doses.   ALLERGIES:  No known drug allergies.   SOCIAL HISTORY:  The patient is a cigarettes smoker.  He denies any drugs or  alcohol.   PHYSICAL EXAMINATION:  GENERAL:  Generally speaking, the patient is in no  acute distress.  He is alert, cooperative and oriented.  HEENT:  Normocephalic and atraumatic.  Sclerae anicteric.  Mucous membranes  moist.  NECK:   Supple.  No thyromegaly, no JVD, no carotid bruits.  CARDIOVASCULAR:  S1 and S2.  Regular rate and rhythm.  No murmurs, rubs,  clicks.  ABDOMEN:  Soft, flat, nontender, nondistended.  Positive bowel sounds.  LUNGS:  Clear to auscultation bilaterally.  No wheezes, no rhonchi.  EXTREMITIES:  No clubbing, cyanosis, or edema.  NEUROLOGIC:  Alert and oriented x3.  Cranial nerves II-XII grossly intact.  Sensation is intact bilaterally, upper and lower extremities.  Reflexes 2+  bilaterally, upper and lower extremities.   LABORATORY DATA:  Acetaminophen level was normal.  Alcohol level less than  5.  Sodium 139, potassium 3.7, chloride 103, CO2 of 27, glucose 122, BUN 16,  creatinine 1.2, calcium 9.4.  White count 9.7, hemoglobin 14.8, hematocrit  42.9, platelets 285.  LFT's revealed AST of 23, ALT 31.  Urine drug screen -  opiate none, cocaine none.  Benzodiazepines none.  Amphetamine positive.  Tetrahydrocannabinol is not detected.  __________ was not detected.  Urinalysis negative.  EKG revealed normal sinus rhythm.  No ST-T wave  changes.  QT not prolonged.   ASSESSMENT AND PLAN:  This is a 40 year old male with a past medical history  significant for depression, schizophrenia and psychosis who presents with  suicidal ideations and attempt.  I was called to medically clear him for  eventual psychiatric admission.  The emergency room doctor suggested that it  would be a better idea for me to admit him and later have psychiatry  consulted with the eventual goal to get him into a psychiatric admission.  I  suppose I agree with that, and I will go ahead and admit him.  He seems  medically pretty stable right now.  He does not have any EKG changes.  His  electrolytes are normal.  I think as soon as a psychiatric team or screener  present, I think he should be ready for inpatient psychiatric admission.  The patient is currently hemodynamically stable, and I am going to go ahead  and continue  putting him on one-on-one observation with close monitoring and  a suicide watch.  In addition, for now I am going to hold all medications  until we get more input from psychiatry.      Lucita Ferrara, MD  Electronically Signed     RR/MEDQ  D:  01/16/2006  T:  01/16/2006  Job:  [id number]

## 2010-08-11 NOTE — Discharge Summary (Signed)
[name], [name]               ACCOUNT NO.:  [id number]   MEDICAL RECORD NO.:  [id number]          PATIENT TYPE:  INP   LOCATION:  1304                         FACILITY:  [facility]   PHYSICIAN:  Kela Millin, M.D.DATE OF BIRTH:  [birth year]   DATE OF ADMISSION:  01/15/2006  DATE OF DISCHARGE:  01/19/2006                                 DISCHARGE SUMMARY   ADDENDUM TO HOSPITAL COURSE:  The patient's hospital stay since his previous  discharge summary of January 18, 2006, has been uneventful and he has  remained hemodynamically stable.  There has been no change in his  medications.  They remain as indicated on the Discharge Summary of January 18, 2006.   DISCHARGE CONDITION:  Improved/stable.   FOLLOW-UP CARE:  The patient is being transferred to [facility] for inpatient  psychiatry care at this time.      Kela Millin, M.D.  Electronically Signed     ACV/MEDQ  D:  01/19/2006  T:  01/19/2006  Job:  [id number]

## 2010-08-11 NOTE — Discharge Summary (Signed)
[name], [name]               ACCOUNT NO.:  [id number]   MEDICAL RECORD NO.:  [id number]          PATIENT TYPE:  INP   LOCATION:  1304                         FACILITY:  [facility]   PHYSICIAN:  Jackie Plum, M.D.DATE OF BIRTH:  February 13, 1971   DATE OF ADMISSION:  01/15/2006  DATE OF DISCHARGE:                           DISCHARGE SUMMARY - REFERRING   INTERIM DISCHARGE SUMMARY   EXPECTED DATE OF DISCHARGE:  To be determined later.   DIAGNOSIS:  1. Suicide attempt.  2. History of depression, schizophrenia and multiple previous suicide      attempts.   REASON FOR ADMISSION:  Suicide attempt.  The patient had taken multiple  Wellbutrin and Trazodone in an attempt to hurt himself.  He was seen in the  ED and was admitted to the hospitalist service pending psychiatric  clearance.  For full details of that inpatient dictation, please review the  dictation of Dr. Lucita Ferrara dictated 01/15/2006.  On admission, the patient  failed supportive care of IV fluids and when in ICU was cleared medically.  He remained hemodynamically stable.  The patient has been seen by  psychiatric service and this patient is being discharged pending bed  availability due to recommended inpatient psych treatment.  The patient is  going to be seen tomorrow by one of my partners and the patient will be  discharged then if bed is available at the time.  During the time of  discharge I will dictate summary in case of discharge condition as well as  final discharge medications may be done.   CURRENT MEDICATIONS:  1. Risperidone 1 mg by mouth every h.s.  2. Tylenol 60 mg every 4 hours as needed.  3. Ativan 1-2 mg IM on IV every 4 as needed.  4. Risperdal 0.5 mg b.i.d. as needed.   PHYSICAL EXAMINATION:  The patient is alert, not ill looking.  VITAL SIGNS:  Hemodynamics remain stable with blood pressure of 110/76,  pulse 64, respirations 16, temperature 97.8 degrees Fahrenheit.  O Sat 94%  on room air.  CARDIAC:  His cardiac pulmonary enzymes are unremarkable.  ABDOMEN:  Soft, nontender.  Bowel sounds positive.  EXTREMITIES:  No cyanosis.   The patient had episode of fever, was started on Rocephin and subsequently  switched to Augmentin overnight without further temperature or fever.  The  fever however, was isolated and therefore, antibiotics may be  discontinued after a few days of treatment.  He had urine culture done as  well as blood cultures on January 16, 2006, all of which came back negative.   Will report to psychiatry physician to cover for admission.  Stable at the  time of this dictation.      Jackie Plum, M.D.  Electronically Signed     GO/MEDQ  D:  01/18/2006  T:  01/18/2006  Job:  [id number]

## 2010-08-11 NOTE — Consult Note (Signed)
[name], [name]               ACCOUNT NO.:  [id number]   MEDICAL RECORD NO.:  [id number]          PATIENT TYPE:  INP   LOCATION:  1304                         FACILITY:  [facility]   PHYSICIAN:  Antonietta Breach, M.D.  DATE OF BIRTH:  06-20-1970   DATE OF CONSULTATION:  01/16/2006  DATE OF DISCHARGE:                                   CONSULTATION   REASON FOR CONSULTATION:  Suicide attempt with overdose.   HISTORY OF PRESENT ILLNESS:  [name] is a 40 year old male who was  admitted to the [facility] after an overdose of  Wellbutrin and trazodone in order to kill himself.  He has been on Prozac,  Wellbutrin, trazodone, and Risperdal after being discharged from [facility] earlier this week.  He is stressed by the fact that he had nowhere  to live.  He also heard voices telling him to kill himself.   Today, he is cooperative with in-hospital healthcare.  However, his judgment  remains impaired.  He is still hearing voices.   PAST PSYCHIATRIC HISTORY:  There is a history of multiple suicide attempts  as well as multiple episodes of auditory hallucinations.  There is a history  of psychiatric admission, as mentioned above.   FAMILY PSYCHIATRIC HISTORY:  None known.   SOCIAL HISTORY:  No alcohol or illegal drugs.  The patient was trying to get  a place in the [facility]  He states that he initially had a place, and  then it was taken by someone else, which was his precipitating stress.   GENERAL MEDICAL PROBLEMS:  Status post polysubstance overdose.   MEDICATIONS:  The MAR is reviewed.  The patient is not currently on any  psychotropics.   He has no known drug allergies.   The CBC is unremarkable.  The metabolic panel is unremarkable.  The SGOT is  23, SGPT 31, albumin 3.5, calcium 9.4, lipase 18.  Tylenol negative, aspirin  negative.  Urine drug screen was positive for amphetamines, negative for  alcohol.   REVIEW OF SYSTEMS:  Noncontributory.   PHYSICAL EXAMINATION:  VITAL SIGNS:  Temperature 98.3, pulse 78,  respirations 16, blood pressure 114/68, O2 saturation on room air 91%.   MENTAL STATUS EXAM:  [name] is an alert male, appearing his stated age,  partially reclined in the supine position in his hospital bed, with partial  eye contact, constricted affect.  Thought process is coherent.  Thought  content:  There are auditory hallucinations present.  The patient  acknowledges suicidal motive and despair over being homeless.  His judgment  is impaired.  His insight does appear to be partial, in that he recognizes  he has a mental illness.  Concentration is mildly decreased.  He is not  combative or agitated, and is cooperative with interview.  He is oriented to  all spheres.  His memory is intact to immediate, recent, and remote.   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  Rule out  schizophrenia, undifferentiated type, chronically acute exacerbation.  Depressive disorder, not otherwise specified.  AXIS II:  Deferred.   AXIS III:  See general medical problems.   AXIS IV:  Primary support group, economic.   AXIS V:  30.   [name] is still at risk to harm himself.  He has impaired judgment.   RECOMMENDATIONS:  1. [name] requires inpatient psychiatric hospitalization to improve his      psychosis and restore his ability for self care and function in the      outpatient world.  2. Would continue the sitter for suicide precautions.  3. Will defer psychotropic treatment at this time pending transfer to a      psychiatric hospital.  Would utilize p.r.n. psychotropic medicines for      comfort, such as Ativan 1-2 mg p.o., IM, or IV q.6 h. p.r.n. anxiety or      insomnia.      Antonietta Breach, M.D.  Electronically Signed     JW/MEDQ  D:  01/16/2006  T:  01/16/2006  Job:  [id number]

## 2011-01-05 LAB — CBC
Hemoglobin: 15.1
MCHC: 33.6
Platelets: 226
RDW: 13.6

## 2011-01-05 LAB — DIFFERENTIAL
Basophils Absolute: 0
Basophils Relative: 1
Lymphocytes Relative: 18
Monocytes Absolute: 0.4
Neutro Abs: 5.3

## 2011-01-05 LAB — BASIC METABOLIC PANEL
BUN: 7
CO2: 28
Calcium: 9.2
Creatinine, Ser: 1.05
GFR calc non Af Amer: >60
Glucose, Bld: 116 — ABNORMAL HIGH
Sodium: 141

## 2011-01-05 LAB — RAPID URINE DRUG SCREEN, HOSP PERFORMED
Amphetamines: NOT DETECTED
Barbiturates: NOT DETECTED
Benzodiazepines: NOT DETECTED
Cocaine: NOT DETECTED
Opiates: NOT DETECTED
Tetrahydrocannabinol: NOT DETECTED

## 2011-01-09 LAB — I-STAT 8, (EC8 V) (CONVERTED LAB)
Acid-Base Excess: 2
BUN: 11
Bicarbonate: 28 — ABNORMAL HIGH
Chloride: 105
Glucose, Bld: 101 — ABNORMAL HIGH
HCT: 44
Hemoglobin: 15
Operator id: 257131
Potassium: 3.6
Sodium: 141
TCO2: 29
pCO2, Ven: 49.2
pH, Ven: 7.363 — ABNORMAL HIGH

## 2011-01-09 LAB — URINALYSIS, ROUTINE W REFLEX MICROSCOPIC
Glucose, UA: NEGATIVE
Hgb urine dipstick: NEGATIVE
Specific Gravity, Urine: 1.021
pH: 5.5

## 2011-01-09 LAB — RAPID URINE DRUG SCREEN, HOSP PERFORMED
Barbiturates: NOT DETECTED
Benzodiazepines: NOT DETECTED
Cocaine: NOT DETECTED
Opiates: NOT DETECTED

## 2011-01-09 LAB — POCT I-STAT CREATININE
Creatinine, Ser: 1.3
Operator id: 257131

## 2011-01-09 LAB — ETHANOL: Alcohol, Ethyl (B): <5

## 2011-02-08 ENCOUNTER — Ambulatory Visit
Admission: RE | Admit: 2011-02-08 | Discharge: 2011-02-08 | Disposition: A | Discharge status: Home / Self Care | Payer: Medicare Other | Source: Ambulatory Visit | Attending: Family Medicine | Admitting: Family Medicine

## 2011-02-08 ENCOUNTER — Other Ambulatory Visit: Payer: Self-pay | Admitting: Family Medicine

## 2011-02-08 DIAGNOSIS — L0291 Cutaneous abscess, unspecified: Secondary | ICD-10-CM

## 2011-02-08 DIAGNOSIS — Z853 Personal history of malignant neoplasm of breast: Secondary | ICD-10-CM

## 2011-05-08 ENCOUNTER — Other Ambulatory Visit: Payer: Self-pay | Admitting: Family Medicine

## 2011-05-08 DIAGNOSIS — R1011 Right upper quadrant pain: Secondary | ICD-10-CM

## 2011-05-09 ENCOUNTER — Ambulatory Visit
Admission: RE | Admit: 2011-05-09 | Discharge: 2011-05-09 | Disposition: A | Discharge status: Home / Self Care | Payer: Medicare Other | Source: Ambulatory Visit | Attending: Family Medicine | Admitting: Family Medicine

## 2011-05-09 DIAGNOSIS — R1011 Right upper quadrant pain: Secondary | ICD-10-CM

## 2011-05-10 ENCOUNTER — Ambulatory Visit
Admission: RE | Admit: 2011-05-10 | Discharge: 2011-05-10 | Disposition: A | Discharge status: Home / Self Care | Payer: Medicare Other | Source: Ambulatory Visit | Attending: Family Medicine | Admitting: Family Medicine

## 2011-05-10 ENCOUNTER — Other Ambulatory Visit: Payer: Self-pay | Admitting: Family Medicine

## 2011-05-10 ENCOUNTER — Other Ambulatory Visit: Payer: Medicare Other

## 2011-05-10 DIAGNOSIS — R1011 Right upper quadrant pain: Secondary | ICD-10-CM

## 2011-11-18 ENCOUNTER — Encounter (HOSPITAL_COMMUNITY): Payer: Self-pay | Admitting: Emergency Medicine

## 2011-11-18 ENCOUNTER — Emergency Department (HOSPITAL_COMMUNITY)
Admission: EM | Admit: 2011-11-18 | Discharge: 2011-11-18 | Disposition: A | Discharge status: Home / Self Care | Payer: Medicare Other | Attending: Emergency Medicine | Admitting: Emergency Medicine

## 2011-11-18 DIAGNOSIS — Z2089 Contact with and (suspected) exposure to other communicable diseases: Secondary | ICD-10-CM | POA: Insufficient documentation

## 2011-11-18 DIAGNOSIS — F3289 Other specified depressive episodes: Secondary | ICD-10-CM | POA: Insufficient documentation

## 2011-11-18 DIAGNOSIS — I10 Essential (primary) hypertension: Secondary | ICD-10-CM | POA: Insufficient documentation

## 2011-11-18 DIAGNOSIS — Z789 Other specified health status: Secondary | ICD-10-CM

## 2011-11-18 DIAGNOSIS — F209 Schizophrenia, unspecified: Secondary | ICD-10-CM | POA: Insufficient documentation

## 2011-11-18 DIAGNOSIS — F329 Major depressive disorder, single episode, unspecified: Secondary | ICD-10-CM | POA: Insufficient documentation

## 2011-11-18 DIAGNOSIS — E7800 Pure hypercholesterolemia, unspecified: Secondary | ICD-10-CM | POA: Insufficient documentation

## 2011-11-18 HISTORY — DX: Pure hypercholesterolemia, unspecified: E78.00

## 2011-11-18 HISTORY — DX: Schizophrenia, unspecified: F20.9

## 2011-11-18 HISTORY — DX: Major depressive disorder, single episode, unspecified: F32.9

## 2011-11-18 HISTORY — DX: Depression, unspecified: F32.A

## 2011-11-18 HISTORY — DX: Essential (primary) hypertension: I10

## 2011-11-18 NOTE — ED Notes (Signed)
Pt here because group home worker was diagnosed with scabies and here to be treated; pt denies rash or itching

## 2011-11-19 NOTE — ED Provider Notes (Signed)
Medical screening examination/treatment/procedure(s) were performed by non-physician practitioner and as supervising physician I was immediately available for consultation/collaboration.   Gwyneth Sprout, MD 11/19/11 2154

## 2011-11-19 NOTE — ED Provider Notes (Signed)
History     CSN: [id number]  Arrival date & time 11/18/11  1235   None     Chief Complaint  Patient presents with  . Rash    (Consider location/radiation/quality/duration/timing/severity/associated sxs/prior treatment) HPI Comments: [name] 41 y.o. male   The chief complaint is: Patient presents with:   Exposure to scabies    Group home resident presents to the emergency department with chief complaint of exposure to rabies. He is accompanied by his group home caretaker, who is the infected party. Patient denies any rashes, itching, or skin eruptions of any kind. Patient is not live in the same room with the caretaker. Caretaker is wearing a glove so as not to touch anything with his infected hand. He is currently taking home precautions by wash all clothing in hot water. He was previously seen at his primary care and has permethrine for treatment.Discussed reasons to seek immediate care. Denies fevers, chills, myalgias, arthralgias, nausea, vomiting, diarrhea.    Patient is a 41 y.o. male presenting with rash. The history is provided by the patient and a caregiver. No language interpreter was used.  Rash     Past Medical History  Diagnosis Date  . Schizophrenia   . Depression   . Hypertension   . Hypercholesteremia     History reviewed. No pertinent past surgical history.  History reviewed. No pertinent family history.  History  Substance Use Topics  . Smoking status: Never Smoker   . Smokeless tobacco: Not on file  . Alcohol Use: No      Review of Systems  Constitutional: Negative for chills.  Respiratory: Negative for shortness of breath.   Cardiovascular: Negative for chest pain.  Gastrointestinal: Positive for abdominal pain. Negative for nausea, vomiting and diarrhea.  Skin: Negative.  Negative for rash.    Allergies  Review of patient's allergies indicates no known allergies.  Home Medications  No current outpatient prescriptions on  file.  BP 130/82  Pulse 91  Temp 98.7 F (37.1 C) (Oral)  Resp 16  SpO2 95%  Physical Exam  Nursing note and vitals reviewed. Constitutional: He appears well-developed and well-nourished. No distress.  HENT:  Head: Normocephalic and atraumatic.  Eyes: Conjunctivae are normal. No scleral icterus.  Neck: Normal range of motion. Neck supple.  Cardiovascular: Normal rate, regular rhythm and normal heart sounds.   Pulmonary/Chest: Effort normal and breath sounds normal. No respiratory distress.  Abdominal: Soft. There is no tenderness.  Musculoskeletal: He exhibits no edema.  Neurological: He is alert.  Skin: Skin is warm and dry. No rash noted. He is not diaphoretic.  Psychiatric: His behavior is normal.    ED Course  Procedures (including critical care time)  Labs Reviewed - No data to display No results found.   1. Normal appearance of tissue       MDM  Patient was is without complaint of rash at this time. I do not feel that the patient needs treatment. Precautions at home are being taken currently. I have warned the patient and his caretaker of signs of infection including rash that develops on the hands or in the inter-digital webs of the fingers, or groin area. Patient and caretaker expressed understanding. All questions answered fully.Discussed reasons to seek immediate care. Patient expresses understanding and agrees with plan.         Arthor Captain, PA-C 11/19/11 [phone]

## 2012-03-05 ENCOUNTER — Encounter: Payer: Self-pay | Admitting: Gastroenterology

## 2012-04-02 ENCOUNTER — Encounter: Payer: Self-pay | Admitting: Gastroenterology

## 2012-04-02 ENCOUNTER — Ambulatory Visit (INDEPENDENT_AMBULATORY_CARE_PROVIDER_SITE_OTHER): Payer: Medicare Other | Admitting: Gastroenterology

## 2012-04-02 VITALS — BP 134/76 | HR 86 | Ht 68.0 in | Wt 272.0 lb

## 2012-04-02 DIAGNOSIS — K219 Gastro-esophageal reflux disease without esophagitis: Secondary | ICD-10-CM

## 2012-04-02 DIAGNOSIS — K625 Hemorrhage of anus and rectum: Secondary | ICD-10-CM

## 2012-04-02 NOTE — Progress Notes (Signed)
History of Present Illness:  42 year old white male referred for evaluation of rectal bleeding. On multiple occasions he has noted blood coating the stools and in the toilet water. He denies change of bowel habits, abdominal or rectal pain. The patient also has pyrosis for which he is taking omeprazole with good response. He denies dysphagia.    Past Medical History  Diagnosis Date  . Schizophrenia   . Depression   . Hypertension   . Hypercholesteremia   . GERD (gastroesophageal reflux disease)   . DM (diabetes mellitus)    Past Surgical History  Procedure Date  . Circumcision    family history is negative for Colon cancer. Current Outpatient Prescriptions  Medication Sig Dispense Refill  . amLODipine (NORVASC) 2.5 MG tablet Take 2.5 mg by mouth daily.      Marland Kitchen buPROPion (WELLBUTRIN SR) 150 MG 12 hr tablet Take 150 mg by mouth daily.      Marland Kitchen docusate sodium (COLACE) 100 MG capsule Take 100 mg by mouth 2 (two) times daily.      . metFORMIN (GLUCOPHAGE) 500 MG tablet Take 500 mg by mouth 2 (two) times daily with a meal.      . omeprazole (PRILOSEC) 20 MG capsule Take 20 mg by mouth daily.      . pravastatin (PRAVACHOL) 20 MG tablet Take 20 mg by mouth daily.      . risperiDONE microspheres (RISPERDAL CONSTA) 37.5 MG injection Inject 37.5 mg into the muscle every 14 (fourteen) days.      . traZODone (DESYREL) 100 MG tablet Take 100 mg by mouth at bedtime.       Allergies as of 04/02/2012  . (No Known Allergies)    reports that he has never smoked. He has never used smokeless tobacco. He reports that he does not drink alcohol or use illicit drugs.     Review of Systems: Pertinent positive and negative review of systems were noted in the above HPI section. All other review of systems were otherwise negative.  Vital signs were reviewed in today's medical record Physical Exam: General: Well developed , well nourished, no acute distress Head: Normocephalic and atraumatic Eyes:   sclerae anicteric, EOMI Ears: Normal auditory acuity Mouth: No deformity or lesions Neck: Supple, no masses or thyromegaly Lungs: Clear throughout to auscultation Heart: Regular rate and rhythm; no murmurs, rubs or bruits Abdomen: Soft, non tender and non distended. No masses, hepatosplenomegaly or hernias noted. Normal Bowel sounds Rectal:deferred Musculoskeletal: Symmetrical with no gross deformities  Skin: No lesions on visible extremities Pulses:  Normal pulses noted Extremities: No clubbing, cyanosis, edema or deformities noted Neurological: Alert oriented x 4, grossly nonfocal Cervical Nodes:  No significant cervical adenopathy Inguinal Nodes: No significant inguinal adenopathy Psychological:  Alert and cooperative. Normal mood and affect

## 2012-04-02 NOTE — Assessment & Plan Note (Signed)
What sounds like limited rectal bleeding could be due to hemorrhoids. A more proximal colonic bleeding source should be ruled out.  Recommendations  #1 colonoscopy

## 2012-04-02 NOTE — Assessment & Plan Note (Signed)
Symptoms are well controlled with omeprazole.

## 2012-04-02 NOTE — Patient Instructions (Addendum)
You have been scheduled for a colonoscopy with propofol. Please follow written instructions given to you at your visit today.  Please pick up your prep kit at the pharmacy within the next 1-3 days. If you use inhalers (even only as needed) or a CPAP machine, please bring them with you on the day of your procedure.  

## 2012-04-03 ENCOUNTER — Encounter: Payer: Medicare Other | Admitting: Gastroenterology

## 2012-04-04 ENCOUNTER — Ambulatory Visit (AMBULATORY_SURGERY_CENTER): Payer: Medicare Other | Admitting: Gastroenterology

## 2012-04-04 ENCOUNTER — Encounter: Payer: Self-pay | Admitting: Gastroenterology

## 2012-04-04 VITALS — BP 119/79 | HR 63 | Temp 97.2°F | Resp 31 | Ht 68.0 in | Wt 272.0 lb

## 2012-04-04 DIAGNOSIS — K625 Hemorrhage of anus and rectum: Secondary | ICD-10-CM

## 2012-04-04 LAB — GLUCOSE, CAPILLARY: Glucose-Capillary: 112 mg/dL — ABNORMAL HIGH (ref 70–99)

## 2012-04-04 MED ORDER — SODIUM CHLORIDE 0.9 % IV SOLN: 500.0000 mL | INTRAVENOUS | Status: DC

## 2012-04-04 NOTE — Progress Notes (Signed)
Awake stable to RR 

## 2012-04-04 NOTE — Patient Instructions (Addendum)
Discharge instructions given with verbal understanding. Handouts on hemorrhoids and a high fiber diet given. Resume previous medications. YOU HAD AN ENDOSCOPIC PROCEDURE TODAY AT THE [facility]: Refer to the procedure report that was given to you for any specific questions about what was found during the examination.  If the procedure report does not answer your questions, please call your gastroenterologist to clarify.  If you requested that your care partner not be given the details of your procedure findings, then the procedure report has been included in a sealed envelope for you to review at your convenience later.  YOU SHOULD EXPECT: Some feelings of bloating in the abdomen. Passage of more gas than usual.  Walking can help get rid of the air that was put into your GI tract during the procedure and reduce the bloating. If you had a lower endoscopy (such as a colonoscopy or flexible sigmoidoscopy) you may notice spotting of blood in your stool or on the toilet paper. If you underwent a bowel prep for your procedure, then you may not have a normal bowel movement for a few days.  DIET: Your first meal following the procedure should be a light meal and then it is ok to progress to your normal diet.  A half-sandwich or bowl of soup is an example of a good first meal.  Heavy or fried foods are harder to digest and may make you feel nauseous or bloated.  Likewise meals heavy in dairy and vegetables can cause extra gas to form and this can also increase the bloating.  Drink plenty of fluids but you should avoid alcoholic beverages for 24 hours.  ACTIVITY: Your care partner should take you home directly after the procedure.  You should plan to take it easy, moving slowly for the rest of the day.  You can resume normal activity the day after the procedure however you should NOT DRIVE or use heavy machinery for 24 hours (because of the sedation medicines used during the test).    SYMPTOMS TO  REPORT IMMEDIATELY: A gastroenterologist can be reached at any hour.  During normal business hours, 8:30 AM to 5:00 PM Monday through Friday, call [phone].  After hours and on weekends, please call the GI answering service at [phone] who will take a message and have the physician on call contact you.   Following lower endoscopy (colonoscopy or flexible sigmoidoscopy):  Excessive amounts of blood in the stool  Significant tenderness or worsening of abdominal pains  Swelling of the abdomen that is new, acute  Fever of 100F or higher FOLLOW UP: If any biopsies were taken you will be contacted by phone or by letter within the next 1-3 weeks.  Call your gastroenterologist if you have not heard about the biopsies in 3 weeks.  Our staff will call the home number listed on your records the next business day following your procedure to check on you and address any questions or concerns that you may have at that time regarding the information given to you following your procedure. This is a courtesy call and so if there is no answer at the home number and we have not heard from you through the emergency physician on call, we will assume that you have returned to your regular daily activities without incident.  SIGNATURES/CONFIDENTIALITY: You and/or your care partner have signed paperwork which will be entered into your electronic medical record.  These signatures attest to the fact that that the information above on your   After Visit Summary has been reviewed and is understood.  Full responsibility of the confidentiality of this discharge information lies with you and/or your care-partner.  

## 2012-04-04 NOTE — Op Note (Signed)
[facility] [address] [place], [address]   COLONOSCOPY PROCEDURE REPORT  PATIENT: [name], [name]  MR#: [id number] BIRTHDATE: 1970/09/03 , 41  yrs. old GENDER: Male ENDOSCOPIST: Louis Meckel, MD REFERRED GN:FAOZH Blount, M.D. PROCEDURE DATE:  04/04/2012 PROCEDURE:   Colonoscopy, diagnostic ASA CLASS:   Class II INDICATIONS:rectal bleeding. MEDICATIONS: MAC sedation, administered by CRNA and Propofol (Diprivan) 180 mg IV  DESCRIPTION OF PROCEDURE:   After the risks benefits and alternatives of the procedure were thoroughly explained, informed consent was obtained.  A digital rectal exam revealed no abnormalities of the rectum.   The LB CF-[id number]  endoscope was introduced through the anus and advanced to the cecum, which was identified by both the appendix and ileocecal valve. No adverse events experienced.   The quality of the prep was Suprep excellent The instrument was then slowly withdrawn as the colon was fully examined.      COLON FINDINGS: A normal appearing cecum, ileocecal valve, and appendiceal orifice were identified.  The ascending, hepatic flexure, transverse, splenic flexure, descending, sigmoid colon and rectum appeared unremarkable.  No polyps or cancers were seen. Retroflexed views revealed no abnormalities. The time to cecum=2 minutes 26 seconds.  Withdrawal time=7 minutes 0 seconds.  The scope was withdrawn and the procedure completed. COMPLICATIONS: There were no complications.  ENDOSCOPIC IMPRESSION: Normal colon  RECOMMENDATIONS: Hemorrhoidal suppositories as needed forl Limited rectal bleeding colonoscopy 10 years   eSigned:  Louis Meckel, MD 04/04/2012 8:52 AM   cc:

## 2012-04-04 NOTE — Progress Notes (Signed)
Patient did not experience any of the following events: a burn prior to discharge; a fall within the facility; wrong site/side/patient/procedure/implant event; or a hospital transfer or hospital admission upon discharge from the facility. (G8907) Patient did not have preoperative order for IV antibiotic SSI prophylaxis. (G8918)  

## 2012-04-07 ENCOUNTER — Telehealth: Payer: Self-pay | Admitting: *Deleted

## 2012-04-07 NOTE — Telephone Encounter (Signed)
Left message on number given in admitting.ewm

## 2013-07-22 ENCOUNTER — Emergency Department (HOSPITAL_COMMUNITY)
Admission: EM | Admit: 2013-07-22 | Discharge: 2013-07-22 | Disposition: A | Discharge status: Home / Self Care | Payer: Medicare Other | Attending: Emergency Medicine | Admitting: Emergency Medicine

## 2013-07-22 ENCOUNTER — Encounter (HOSPITAL_COMMUNITY): Payer: Self-pay | Admitting: Emergency Medicine

## 2013-07-22 DIAGNOSIS — K219 Gastro-esophageal reflux disease without esophagitis: Secondary | ICD-10-CM | POA: Insufficient documentation

## 2013-07-22 DIAGNOSIS — F3289 Other specified depressive episodes: Secondary | ICD-10-CM | POA: Insufficient documentation

## 2013-07-22 DIAGNOSIS — N189 Chronic kidney disease, unspecified: Secondary | ICD-10-CM | POA: Insufficient documentation

## 2013-07-22 DIAGNOSIS — E7800 Pure hypercholesterolemia, unspecified: Secondary | ICD-10-CM | POA: Insufficient documentation

## 2013-07-22 DIAGNOSIS — F329 Major depressive disorder, single episode, unspecified: Secondary | ICD-10-CM | POA: Insufficient documentation

## 2013-07-22 DIAGNOSIS — Z87442 Personal history of urinary calculi: Secondary | ICD-10-CM | POA: Insufficient documentation

## 2013-07-22 DIAGNOSIS — E119 Type 2 diabetes mellitus without complications: Secondary | ICD-10-CM | POA: Insufficient documentation

## 2013-07-22 DIAGNOSIS — I10 Essential (primary) hypertension: Secondary | ICD-10-CM | POA: Insufficient documentation

## 2013-07-22 DIAGNOSIS — Z8659 Personal history of other mental and behavioral disorders: Secondary | ICD-10-CM | POA: Insufficient documentation

## 2013-07-22 DIAGNOSIS — R109 Unspecified abdominal pain: Secondary | ICD-10-CM

## 2013-07-22 DIAGNOSIS — Z87891 Personal history of nicotine dependence: Secondary | ICD-10-CM | POA: Insufficient documentation

## 2013-07-22 DIAGNOSIS — Z79899 Other long term (current) drug therapy: Secondary | ICD-10-CM | POA: Insufficient documentation

## 2013-07-22 DIAGNOSIS — E669 Obesity, unspecified: Secondary | ICD-10-CM | POA: Insufficient documentation

## 2013-07-22 DIAGNOSIS — F411 Generalized anxiety disorder: Secondary | ICD-10-CM | POA: Insufficient documentation

## 2013-07-22 DIAGNOSIS — R1011 Right upper quadrant pain: Secondary | ICD-10-CM | POA: Insufficient documentation

## 2013-07-22 LAB — COMPREHENSIVE METABOLIC PANEL
ALBUMIN: 3.5 g/dL (ref 3.5–5.2)
ALK PHOS: 93 U/L (ref 39–117)
ALT: 57 U/L — ABNORMAL HIGH (ref 0–53)
AST: 41 U/L — AB (ref 0–37)
BILIRUBIN TOTAL: 0.3 mg/dL (ref 0.3–1.2)
BUN: 16 mg/dL (ref 6–23)
CHLORIDE: 106 meq/L (ref 96–112)
CO2: 26 meq/L (ref 19–32)
Calcium: 9.2 mg/dL (ref 8.4–10.5)
Creatinine, Ser: 1.09 mg/dL (ref 0.50–1.35)
GFR calc Af Amer: >90 mL/min (ref >90)
GFR calc non Af Amer: 81 mL/min — ABNORMAL LOW (ref >90)
Glucose, Bld: 105 mg/dL — ABNORMAL HIGH (ref 70–99)
POTASSIUM: 3.8 meq/L (ref 3.7–5.3)
Sodium: 143 mEq/L (ref 137–147)
Total Protein: 7.2 g/dL (ref 6.0–8.3)

## 2013-07-22 LAB — CBC WITH DIFFERENTIAL/PLATELET
BASOS ABS: 0 10*3/uL (ref 0.0–0.1)
BASOS PCT: 0 % (ref 0–1)
Eosinophils Absolute: 0.3 10*3/uL (ref 0.0–0.7)
Eosinophils Relative: 3 % (ref 0–5)
HCT: 42.6 % (ref 39.0–52.0)
HEMOGLOBIN: 14.3 g/dL (ref 13.0–17.0)
LYMPHS PCT: 45 % (ref 12–46)
Lymphs Abs: 3.3 10*3/uL (ref 0.7–4.0)
MCH: 28.9 pg (ref 26.0–34.0)
MCHC: 33.6 g/dL (ref 30.0–36.0)
MCV: 86.1 fL (ref 78.0–100.0)
MONOS PCT: 7 % (ref 3–12)
Monocytes Absolute: 0.5 10*3/uL (ref 0.1–1.0)
NEUTROS ABS: 3.3 10*3/uL (ref 1.7–7.7)
NEUTROS PCT: 44 % (ref 43–77)
Platelets: 268 10*3/uL (ref 150–400)
RBC: 4.95 MIL/uL (ref 4.22–5.81)
RDW: 13.2 % (ref 11.5–15.5)
WBC: 7.4 10*3/uL (ref 4.0–10.5)

## 2013-07-22 LAB — LIPASE, BLOOD: Lipase: 31 U/L (ref 11–59)

## 2013-07-22 NOTE — ED Provider Notes (Signed)
Medical screening examination/treatment/procedure(s) were performed by non-physician practitioner and as supervising physician I was immediately available for consultation/collaboration.   EKG Interpretation None        Tyrik Stetzer, MD 07/22/13 0301 

## 2013-07-22 NOTE — ED Provider Notes (Signed)
CSN: [id number]     Arrival date & time 07/22/13  0001 History   First MD Initiated Contact with Patient 07/22/13 0025     Chief Complaint  Patient presents with  . Abdominal Pain     (Consider location/radiation/quality/duration/timing/severity/associated sxs/prior Treatment) HPI Comments: Patient reports RUQ stabbing pain several hours after eating Pizza and laying down  The pain would resolve when upright but return when he would lay down  On arrival to ED patinet is pain free, no nausea, vomiting, diarrhea   Patient is a 43 y.o. male presenting with abdominal pain. The history is provided by the patient.  Abdominal Pain Pain location:  RUQ Pain quality: stabbing   Pain radiates to:  Does not radiate Pain severity:  Moderate Onset quality:  Sudden Timing:  Intermittent Progression:  Resolved Chronicity:  Recurrent Context: eating and previous surgery   Context: not trauma   Relieved by:  None tried Worsened by:  Nothing tried Ineffective treatments:  None tried Associated symptoms: no constipation, no diarrhea, no dysuria, no fever and no nausea   Risk factors: obesity     Past Medical History  Diagnosis Date  . Schizophrenia   . Depression   . Hypertension   . Hypercholesteremia   . GERD (gastroesophageal reflux disease)   . DM (diabetes mellitus)   . Allergy   . Anxiety   . Chronic kidney disease     kidney stones removed x1   Past Surgical History  Procedure Laterality Date  . Circumcision    . Lithotripsy      kidney stone crushed x1   Family History  Problem Relation Age of Onset  . Colon cancer Neg Hx   . Esophageal cancer Neg Hx   . Rectal cancer Neg Hx   . Stomach cancer Neg Hx    History  Substance Use Topics  . Smoking status: Former Games developermoker  . Smokeless tobacco: Never Used  . Alcohol Use: No    Review of Systems  Constitutional: Negative for fever.  Gastrointestinal: Positive for abdominal pain. Negative for nausea, diarrhea and  constipation.  Genitourinary: Negative for dysuria.  Skin: Negative for rash and wound.  Neurological: Negative for dizziness.  All other systems reviewed and are negative.     Allergies  Review of patient's allergies indicates no known allergies.  Home Medications   Prior to Admission medications   Medication Sig Start Date End Date Taking? Authorizing Provider  ARIPiprazole (ABILIFY) 9.75 MG/1.3ML injection Inject 9.75 mg into the muscle every 30 (thirty) days.   Yes Historical Provider, MD  amLODipine (NORVASC) 2.5 MG tablet Take 2.5 mg by mouth daily.    Historical Provider, MD  buPROPion (WELLBUTRIN SR) 150 MG 12 hr tablet Take 150 mg by mouth daily.    Historical Provider, MD  docusate sodium (COLACE) 100 MG capsule Take 100 mg by mouth 2 (two) times daily.    Historical Provider, MD  fenofibrate (TRICOR) 48 MG tablet  03/26/12   Historical Provider, MD  metFORMIN (GLUCOPHAGE) 500 MG tablet Take 500 mg by mouth 2 (two) times daily with a meal.    Historical Provider, MD  omeprazole (PRILOSEC) 20 MG capsule Take 20 mg by mouth daily.    Historical Provider, MD  pravastatin (PRAVACHOL) 20 MG tablet Take 20 mg by mouth daily.    Historical Provider, MD  traZODone (DESYREL) 100 MG tablet Take 100 mg by mouth at bedtime.    Historical Provider, MD   BP 111/55  Pulse  76  Temp(Src) 97.9 F (36.6 C) (Oral)  Resp 18  SpO2 93% Physical Exam  Nursing note and vitals reviewed. Constitutional: He appears well-developed and well-nourished.  obese  HENT:  Head: Normocephalic.  Mouth/Throat: Oropharynx is clear and moist.  Eyes: Pupils are equal, round, and reactive to light.  Neck: Normal range of motion.  Cardiovascular: Normal rate and regular rhythm.   Pulmonary/Chest: Effort normal and breath sounds normal.  Abdominal: Soft. Bowel sounds are normal. He exhibits no distension. There is no tenderness. There is no rebound and no guarding.  Musculoskeletal: Normal range of motion.   Neurological: He is alert.  Skin: Skin is warm. No rash noted. No pallor.    ED Course  Procedures (including critical care time) Labs Review Labs Reviewed  COMPREHENSIVE METABOLIC PANEL - Abnormal; Notable for the following:    Glucose, Bld 105 (*)    AST 41 (*)    ALT 57 (*)    GFR calc non Af Amer 81 (*)    All other components within normal limits  CBC WITH DIFFERENTIAL  LIPASE, BLOOD    Imaging Review No results found.   EKG Interpretation None      MDM  Labs have been reviewed.  Patient has had no recurrence of his abdominal pain, no nausea, vomiting, diarrhea.  He'll be discharged home with instructions to return if he develops new or worsening symptoms Final diagnoses:  Abdominal pain         Arman FilterGail K Vrinda Heckstall, NP 07/22/13 [phone]

## 2013-07-22 NOTE — ED Notes (Signed)
Bed: [id number] Expected date:  Expected time:  Means of arrival:  Comments: EMS 28M RLQ pain

## 2013-07-22 NOTE — ED Notes (Signed)
Brought in by PTAR with c/o abdominal pain.  Per PTAR, pt reported that he felt sudden abdominal pain to right lower quadrant approximately an hour ago; pt denies nausea or vomiting or diarrhea.  Pt presents to ED ambulatory.

## 2013-07-22 NOTE — Discharge Instructions (Signed)
Data lab work, is normal.  You have had a cholecystectomy, which means the removal of your gallbladder if you develop new or worsening symptoms

## 2013-09-29 ENCOUNTER — Emergency Department (HOSPITAL_COMMUNITY): Payer: Medicare Other

## 2013-09-29 ENCOUNTER — Emergency Department (HOSPITAL_COMMUNITY)
Admission: EM | Admit: 2013-09-29 | Discharge: 2013-09-30 | Disposition: A | Discharge status: Home / Self Care | Payer: Medicare Other | Attending: Emergency Medicine | Admitting: Emergency Medicine

## 2013-09-29 ENCOUNTER — Encounter (HOSPITAL_COMMUNITY): Payer: Self-pay | Admitting: Emergency Medicine

## 2013-09-29 DIAGNOSIS — I129 Hypertensive chronic kidney disease with stage 1 through stage 4 chronic kidney disease, or unspecified chronic kidney disease: Secondary | ICD-10-CM | POA: Diagnosis not present

## 2013-09-29 DIAGNOSIS — E119 Type 2 diabetes mellitus without complications: Secondary | ICD-10-CM | POA: Diagnosis not present

## 2013-09-29 DIAGNOSIS — K219 Gastro-esophageal reflux disease without esophagitis: Secondary | ICD-10-CM | POA: Insufficient documentation

## 2013-09-29 DIAGNOSIS — F3289 Other specified depressive episodes: Secondary | ICD-10-CM | POA: Diagnosis not present

## 2013-09-29 DIAGNOSIS — M79609 Pain in unspecified limb: Secondary | ICD-10-CM | POA: Diagnosis present

## 2013-09-29 DIAGNOSIS — M25539 Pain in unspecified wrist: Secondary | ICD-10-CM | POA: Insufficient documentation

## 2013-09-29 DIAGNOSIS — Z87891 Personal history of nicotine dependence: Secondary | ICD-10-CM | POA: Insufficient documentation

## 2013-09-29 DIAGNOSIS — Z87442 Personal history of urinary calculi: Secondary | ICD-10-CM | POA: Insufficient documentation

## 2013-09-29 DIAGNOSIS — E7800 Pure hypercholesterolemia, unspecified: Secondary | ICD-10-CM | POA: Diagnosis not present

## 2013-09-29 DIAGNOSIS — Z79899 Other long term (current) drug therapy: Secondary | ICD-10-CM | POA: Insufficient documentation

## 2013-09-29 DIAGNOSIS — F329 Major depressive disorder, single episode, unspecified: Secondary | ICD-10-CM | POA: Insufficient documentation

## 2013-09-29 DIAGNOSIS — Z8659 Personal history of other mental and behavioral disorders: Secondary | ICD-10-CM | POA: Diagnosis not present

## 2013-09-29 DIAGNOSIS — F411 Generalized anxiety disorder: Secondary | ICD-10-CM | POA: Diagnosis not present

## 2013-09-29 DIAGNOSIS — M79632 Pain in left forearm: Secondary | ICD-10-CM

## 2013-09-29 LAB — I-STAT TROPONIN, ED
TROPONIN I, POC: 0 ng/mL (ref 0.00–0.08)
Troponin i, poc: 0 ng/mL (ref 0.00–0.08)

## 2013-09-29 LAB — CBC
HCT: 46.1 % (ref 39.0–52.0)
Hemoglobin: 15.5 g/dL (ref 13.0–17.0)
MCH: 29.1 pg (ref 26.0–34.0)
MCHC: 33.6 g/dL (ref 30.0–36.0)
MCV: 86.7 fL (ref 78.0–100.0)
PLATELETS: 289 10*3/uL (ref 150–400)
RBC: 5.32 MIL/uL (ref 4.22–5.81)
RDW: 13 % (ref 11.5–15.5)
WBC: 11.8 10*3/uL — ABNORMAL HIGH (ref 4.0–10.5)

## 2013-09-29 LAB — BASIC METABOLIC PANEL
Anion gap: 17 — ABNORMAL HIGH (ref 5–15)
BUN: 19 mg/dL (ref 6–23)
CALCIUM: 9.6 mg/dL (ref 8.4–10.5)
CO2: 22 mEq/L (ref 19–32)
Chloride: 106 mEq/L (ref 96–112)
Creatinine, Ser: 1.44 mg/dL — ABNORMAL HIGH (ref 0.50–1.35)
GFR, EST AFRICAN AMERICAN: 66 mL/min — AB (ref >90)
GFR, EST NON AFRICAN AMERICAN: 57 mL/min — AB (ref >90)
GLUCOSE: 145 mg/dL — AB (ref 70–99)
Potassium: 3.5 mEq/L — ABNORMAL LOW (ref 3.7–5.3)
SODIUM: 145 meq/L (ref 137–147)

## 2013-09-29 NOTE — Discharge Instructions (Signed)
Please follow up with your primary care physician in 1-2 days. If you do not have one please call the [facility] number listed above. Please read all discharge instructions and return precautions.  ° °Muscle Cramps and Spasms °Muscle cramps and spasms occur when a muscle or muscles tighten and you have no control over this tightening (involuntary muscle contraction). They are a common problem and can develop in any muscle. The most common place is in the calf muscles of the leg. Both muscle cramps and muscle spasms are involuntary muscle contractions, but they also have differences:  °· Muscle cramps are sporadic and painful. They may last a few seconds to a quarter of an hour. Muscle cramps are often more forceful and last longer than muscle spasms. °· Muscle spasms may or may not be painful. They may also last just a few seconds or much longer. °CAUSES  °It is uncommon for cramps or spasms to be due to a serious underlying problem. In many cases, the cause of cramps or spasms is unknown. Some common causes are:  °· Overexertion.   °· Overuse from repetitive motions (doing the same thing over and over).   °· Remaining in a certain position for a long period of time.   °· Improper preparation, form, or technique while performing a sport or activity.   °· Dehydration.   °· Injury.   °· Side effects of some medicines.   °· Abnormally low levels of the salts and ions in your blood (electrolytes), especially potassium and calcium. This could happen if you are taking water pills (diuretics) or you are pregnant.   °Some underlying medical problems can make it more likely to develop cramps or spasms. These include, but are not limited to:  °· Diabetes.   °· Parkinson disease.   °· Hormone disorders, such as thyroid problems.   °· Alcohol abuse.   °· Diseases specific to muscles, joints, and bones.   °· Blood vessel disease where not enough blood is getting to the muscles.   °HOME CARE INSTRUCTIONS   °· Stay well hydrated. Drink enough water and fluids to keep your urine clear or pale yellow. °· It may be helpful to massage, stretch, and relax the affected muscle. °· For tight or tense muscles, use a warm towel, heating pad, or hot shower water directed to the affected area. °· If you are sore or have pain after a cramp or spasm, applying ice to the affected area may relieve discomfort. °¨ Put ice in a plastic bag. °¨ Place a towel between your skin and the bag. °¨ Leave the ice on for 15-20 minutes, 03-04 times a day. °· Medicines used to treat a known cause of cramps or spasms may help reduce their frequency or severity. Only take over-the-counter or prescription medicines as directed by your caregiver. °SEEK MEDICAL CARE IF:  °Your cramps or spasms get more severe, more frequent, or do not improve over time.  °MAKE SURE YOU:  °· Understand these instructions. °· Will watch your condition. °· Will get help right away if you are not doing well or get worse. °Document Released: 09/01/2001 Document Revised: 07/07/2012 Document Reviewed: 02/27/2012 °Exit[employer]® Patient Information ©2015 [employer]. This information is not intended to replace advice given to you by your health care provider. Make sure you discuss any questions you have with your health care provider. ° °

## 2013-09-29 NOTE — ED Notes (Signed)
Pt states that the cramp in his left arm has subsided.

## 2013-09-29 NOTE — ED Provider Notes (Signed)
CSN: [id number]     Arrival date & time 09/29/13  1950 History   First MD Initiated Contact with Patient 09/29/13 2231     Chief Complaint  Patient presents with  . 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 He states the cramp lasted approximately one hour and resolved after massaging the pain. He denies any precipitating injury or fall, chest pain, shortness of breath, nausea, vomiting, abdominal pain. No recurrent pain. Patient is left-hand dominant.  Patient is a 43 y.o. male presenting with arm pain.  Arm Pain Associated symptoms include myalgias. Pertinent negatives include no chills or fever.    Past Medical History  Diagnosis Date  . Schizophrenia   . Depression   . Hypertension   . Hypercholesteremia   . GERD (gastroesophageal reflux disease)   . DM (diabetes mellitus)   . Allergy   . Anxiety   . Chronic kidney disease     kidney stones removed x1   Past Surgical History  Procedure Laterality Date  . Circumcision    . Lithotripsy      kidney stone crushed x1   Family History  Problem Relation Age of Onset  . Colon cancer Neg Hx   . Esophageal cancer Neg Hx   . Rectal cancer Neg Hx   . Stomach cancer Neg Hx    History  Substance Use Topics  . Smoking status: Former Games developermoker  . Smokeless tobacco: Never Used  . Alcohol Use: No    Review of Systems  Constitutional: Negative for fever and chills.  Musculoskeletal: Positive for myalgias.  All other systems reviewed and are negative.     Allergies  Review of patient's allergies indicates no known allergies.  Home Medications    Prior to Admission medications   Medication Sig Start Date End Date Taking? Authorizing Provider  acetaminophen (TYLENOL) 500 MG tablet Take 1,500 mg by mouth every 6 (six) hours as needed for mild pain.   Yes Historical Provider, MD  amLODipine (NORVASC) 2.5 MG tablet Take 2.5 mg by mouth daily.   Yes Historical Provider, MD  ARIPiprazole (ABILIFY) 9.75 MG/1.3ML injection Inject 9.75 mg into the muscle every 30 (thirty) days.   Yes Historical Provider, MD  buPROPion (WELLBUTRIN SR) 150 MG 12 hr tablet Take 150 mg by mouth daily.   Yes Historical Provider, MD  docusate sodium (COLACE) 100 MG capsule Take 100 mg by mouth 2 (two) times daily.   Yes Historical Provider, MD  fenofibrate (TRICOR) 48 MG tablet Take 48 mg by mouth daily.  03/26/12  Yes Historical Provider, MD  metFORMIN (GLUCOPHAGE) 500 MG tablet Take 500 mg by mouth 2 (two) times daily with a meal.   Yes Historical Provider, MD  omeprazole (PRILOSEC) 20 MG capsule Take 20 mg by mouth daily.   Yes Historical Provider, MD  pravastatin (PRAVACHOL) 20 MG tablet Take 20 mg by mouth daily.   Yes Historical Provider, MD  traZODone (DESYREL) 100 MG tablet Take 100 mg by mouth at bedtime.   Yes Historical Provider, MD   BP 101/57  Pulse 72  Temp(Src) 98.1 F (36.7 C) (Oral)  Resp 23  Ht 5\' 8"  (1.727 m)  Wt 256 lb (116.121 kg)  BMI 38.93 kg/m2  SpO2 96% Physical Exam  Nursing note and vitals reviewed. Constitutional: He is oriented to person, place, and time. He appears well-developed and well-nourished. No distress.  HENT:  Head: Normocephalic and atraumatic.  Right Ear: External ear normal.  Left Ear: External ear normal.  Nose: Nose normal.  Mouth/Throat: Oropharynx is clear and moist.  Eyes: Conjunctivae and EOM are normal. Pupils are equal, round, and reactive to light.  Neck: Normal range of motion and full passive range of motion without pain. Neck supple. No spinous process tenderness and no muscular tenderness present.   Cardiovascular: Normal rate, regular rhythm, normal heart sounds and intact distal pulses.   Pulmonary/Chest: Effort normal and breath sounds normal. No respiratory distress.  Abdominal: Soft. There is no tenderness.  Musculoskeletal: Normal range of motion.       Right shoulder: Normal.       Left shoulder: Normal.       Right wrist: Normal.       Left wrist: Normal.       Right upper arm: Normal.       Left upper arm: Normal.       Right forearm: Normal.       Left forearm: Normal.       Arms:      Right hand: Normal.       Left hand: Normal.  Lymphadenopathy:    He has no cervical adenopathy.  Neurological: He is alert and oriented to person, place, and time. He has normal strength. Gait normal. GCS eye subscore is 4. GCS verbal subscore is 5. GCS motor subscore is 6.  Sensation grossly intact. Moves all extremities without ataxia.   Skin: Skin is warm and dry. He is not diaphoretic.  Psychiatric: He has a normal mood and affect.    ED Course  Procedures (including critical care time) Labs Review Labs Reviewed  CBC - Abnormal; Notable for the following:    WBC 11.8 (*)    All other components within normal limits  BASIC METABOLIC PANEL - Abnormal; Notable for the following:    Potassium 3.5 (*)    Glucose, Bld 145 (*)    Creatinine, Ser 1.44 (*)    GFR calc non Af Amer 57 (*)    GFR calc Af Amer 66 (*)    Anion gap 17 (*)    All other components within normal limits  I-STAT TROPOININ, ED  Rosezena Sensor, ED    Imaging Review Dg Chest 2 View  09/29/2013   CLINICAL DATA:  Left arm cramping, pain.  EXAM: CHEST  2 VIEW  COMPARISON:  Chest radiograph May 16, 2010 and CT of the chest March 11, 2009  FINDINGS: Cardiomediastinal silhouette is unremarkable. Subcentimeter nodular density in left lung base seen only on frontal radiograph, not definitely present on prior examination. The lungs are clear without pleural effusions or focal consolidations. Trachea projects  midline and there is no pneumothorax. Soft tissue planes and included osseous structures are non-suspicious. Surgical clips in the abdomen likely reflect cholecystectomy.  IMPRESSION: No acute cardiopulmonary process.  Nodular density in left lung base seen only on the frontal radiograph, which could be parenchymal or related to the anterior fifth rib, oblique views could be obtained if clinically indicated versus followup CT of the chest.   Electronically Signed   By: Awilda Metro   On: 09/29/2013 20:55     EKG Interpretation   Date/Time:  Tuesday September 29 2013 19:59:02 EDT Ventricular Rate:  99 PR Interval:  146 QRS Duration: 68 QT Interval:  334 QTC Calculation: 428 R Axis:   -3 Text Interpretation:  Normal sinus rhythm Possible Inferior infarct , age  undetermined Cannot rule out Anterior infarct , age undetermined Abnormal  ECG No significant change was found Confirmed by Manus GunningANCOUR  MD, STEPHEN  [phone]) on 09/29/2013 11:13:41 PM      MDM   Final diagnoses:  Left forearm pain    Filed Vitals:   09/29/13 2330  BP: 101/57  Pulse: 72  Temp:   Resp: 23   Afebrile, NAD, non-toxic appearing, AAOx4. No neurofocal deficits.  Neurovascularly intact. Normal sensation. No obvious deformity or injury. Pain sounds musculoskeletal in nature. EKG, chest x-ray, troponin negative. I have reviewed nursing notes, vital signs, and all appropriate lab and imaging results for this patient. Return precautions discussed. Patient is agreeable to plan. Patient is stable at time of discharge         Jeannetta EllisJennifer L Lamica Mccart, PA-C 09/30/13 [phone]

## 2013-09-29 NOTE — ED Notes (Signed)
Patient states that he was working and was vacuuming and started having cramping in his left arm.  He states that he was sweating a lot while he was cleaning.  He denies any chest pain or shortness of breath.  Patient states that the cramping went from his fingers to his elbow.  Patient denies any nausea or vomiting.

## 2013-09-30 NOTE — ED Provider Notes (Signed)
Medical screening examination/treatment/procedure(s) were conducted as a shared visit with non-physician practitioner(s) and myself.  I personally evaluated the patient during the encounter.  L hand and arm cramp that onset after vacuuming.  Now resolved.  No CP or SOB. Low suspicion for ACS or PE.   EKG Interpretation   Date/Time:  Tuesday September 29 2013 19:59:02 EDT Ventricular Rate:  99 PR Interval:  146 QRS Duration: 68 QT Interval:  334 QTC Calculation: 428 R Axis:   -3 Text Interpretation:  Normal sinus rhythm Possible Inferior infarct , age  undetermined Cannot rule out Anterior infarct , age undetermined Abnormal  ECG No significant change was found Confirmed by Manus GunningANCOUR  MD, Hong Moring  [phone]([id number]) on 09/29/2013 11:13:41 PM       Glynn OctaveStephen Simrah Chatham, MD 09/30/13 [phone]

## 2013-10-07 ENCOUNTER — Observation Stay (HOSPITAL_COMMUNITY)
Admission: EM | Admit: 2013-10-07 | Discharge: 2013-10-08 | Disposition: A | Discharge status: Home / Self Care | Payer: Medicare Other | Attending: Family Medicine | Admitting: Family Medicine

## 2013-10-07 ENCOUNTER — Emergency Department (HOSPITAL_COMMUNITY): Payer: Medicare Other

## 2013-10-07 ENCOUNTER — Encounter (HOSPITAL_COMMUNITY): Payer: Self-pay | Admitting: Emergency Medicine

## 2013-10-07 DIAGNOSIS — F3289 Other specified depressive episodes: Secondary | ICD-10-CM | POA: Insufficient documentation

## 2013-10-07 DIAGNOSIS — K219 Gastro-esophageal reflux disease without esophagitis: Secondary | ICD-10-CM | POA: Insufficient documentation

## 2013-10-07 DIAGNOSIS — F329 Major depressive disorder, single episode, unspecified: Secondary | ICD-10-CM | POA: Insufficient documentation

## 2013-10-07 DIAGNOSIS — Z8659 Personal history of other mental and behavioral disorders: Secondary | ICD-10-CM | POA: Insufficient documentation

## 2013-10-07 DIAGNOSIS — Z79899 Other long term (current) drug therapy: Secondary | ICD-10-CM | POA: Diagnosis not present

## 2013-10-07 DIAGNOSIS — Z87442 Personal history of urinary calculi: Secondary | ICD-10-CM | POA: Diagnosis not present

## 2013-10-07 DIAGNOSIS — R110 Nausea: Secondary | ICD-10-CM | POA: Diagnosis not present

## 2013-10-07 DIAGNOSIS — R0789 Other chest pain: Principal | ICD-10-CM | POA: Insufficient documentation

## 2013-10-07 DIAGNOSIS — E7800 Pure hypercholesterolemia, unspecified: Secondary | ICD-10-CM | POA: Insufficient documentation

## 2013-10-07 DIAGNOSIS — E118 Type 2 diabetes mellitus with unspecified complications: Secondary | ICD-10-CM | POA: Insufficient documentation

## 2013-10-07 DIAGNOSIS — Z87891 Personal history of nicotine dependence: Secondary | ICD-10-CM | POA: Diagnosis not present

## 2013-10-07 DIAGNOSIS — R079 Chest pain, unspecified: Secondary | ICD-10-CM | POA: Diagnosis present

## 2013-10-07 DIAGNOSIS — R42 Dizziness and giddiness: Secondary | ICD-10-CM | POA: Insufficient documentation

## 2013-10-07 DIAGNOSIS — N189 Chronic kidney disease, unspecified: Secondary | ICD-10-CM | POA: Diagnosis not present

## 2013-10-07 DIAGNOSIS — F203 Undifferentiated schizophrenia: Secondary | ICD-10-CM

## 2013-10-07 DIAGNOSIS — F411 Generalized anxiety disorder: Secondary | ICD-10-CM | POA: Diagnosis not present

## 2013-10-07 DIAGNOSIS — I158 Other secondary hypertension: Secondary | ICD-10-CM | POA: Insufficient documentation

## 2013-10-07 DIAGNOSIS — E138 Other specified diabetes mellitus with unspecified complications: Secondary | ICD-10-CM

## 2013-10-07 HISTORY — DX: Type 2 diabetes mellitus without complications: E11.9

## 2013-10-07 HISTORY — DX: Headache, unspecified: R51.9

## 2013-10-07 HISTORY — DX: Calculus of kidney: N20.0

## 2013-10-07 HISTORY — DX: Headache: R51

## 2013-10-07 LAB — COMPREHENSIVE METABOLIC PANEL
ALT: 28 U/L (ref 0–53)
ANION GAP: 11 (ref 5–15)
AST: 23 U/L (ref 0–37)
Albumin: 3.4 g/dL — ABNORMAL LOW (ref 3.5–5.2)
Alkaline Phosphatase: 76 U/L (ref 39–117)
BUN: 18 mg/dL (ref 6–23)
CALCIUM: 8.4 mg/dL (ref 8.4–10.5)
CO2: 26 mEq/L (ref 19–32)
CREATININE: 1.2 mg/dL (ref 0.50–1.35)
Chloride: 110 mEq/L (ref 96–112)
GFR calc non Af Amer: 72 mL/min — ABNORMAL LOW (ref >90)
GFR, EST AFRICAN AMERICAN: 83 mL/min — AB (ref >90)
GLUCOSE: 88 mg/dL (ref 70–99)
Potassium: 3.8 mEq/L (ref 3.7–5.3)
Sodium: 147 mEq/L (ref 137–147)
Total Bilirubin: 0.3 mg/dL (ref 0.3–1.2)
Total Protein: 6.2 g/dL (ref 6.0–8.3)

## 2013-10-07 LAB — CBC WITH DIFFERENTIAL/PLATELET
BASOS ABS: 0 10*3/uL (ref 0.0–0.1)
Basophils Relative: 0 % (ref 0–1)
EOS PCT: 1 % (ref 0–5)
Eosinophils Absolute: 0.1 10*3/uL (ref 0.0–0.7)
HEMATOCRIT: 39.6 % (ref 39.0–52.0)
Hemoglobin: 12.9 g/dL — ABNORMAL LOW (ref 13.0–17.0)
LYMPHS ABS: 3.1 10*3/uL (ref 0.7–4.0)
Lymphocytes Relative: 38 % (ref 12–46)
MCH: 28 pg (ref 26.0–34.0)
MCHC: 32.6 g/dL (ref 30.0–36.0)
MCV: 86.1 fL (ref 78.0–100.0)
MONO ABS: 0.3 10*3/uL (ref 0.1–1.0)
Monocytes Relative: 4 % (ref 3–12)
Neutro Abs: 4.7 10*3/uL (ref 1.7–7.7)
Neutrophils Relative %: 57 % (ref 43–77)
Platelets: 233 10*3/uL (ref 150–400)
RBC: 4.6 MIL/uL (ref 4.22–5.81)
RDW: 13.3 % (ref 11.5–15.5)
WBC: 8.1 10*3/uL (ref 4.0–10.5)

## 2013-10-07 LAB — LIPID PANEL
CHOLESTEROL: 150 mg/dL (ref 0–200)
HDL: 35 mg/dL — ABNORMAL LOW (ref >39)
LDL CALC: 92 mg/dL (ref 0–99)
TRIGLYCERIDES: 117 mg/dL (ref <150)
Total CHOL/HDL Ratio: 4.3 RATIO
VLDL: 23 mg/dL (ref 0–40)

## 2013-10-07 LAB — TROPONIN I
Troponin I: <0.3 ng/mL (ref <0.30)
Troponin I: <0.3 ng/mL (ref <0.30)

## 2013-10-07 LAB — GLUCOSE, CAPILLARY: Glucose-Capillary: 88 mg/dL (ref 70–99)

## 2013-10-07 MED ORDER — PANTOPRAZOLE SODIUM 40 MG PO TBEC
40.0000 mg | DELAYED_RELEASE_TABLET | Freq: Every day | ORAL | Status: DC
  Administered 2013-10-08: 40 mg via ORAL
  Filled 2013-10-07: qty 1

## 2013-10-07 MED ORDER — METFORMIN HCL 500 MG PO TABS
500.0000 mg | ORAL_TABLET | Freq: Two times a day (BID) | ORAL | Status: DC
  Administered 2013-10-08: 500 mg via ORAL
  Filled 2013-10-07 (×3): qty 1

## 2013-10-07 MED ORDER — ONDANSETRON HCL 4 MG/2ML IJ SOLN: 4.0000 mg | Freq: Four times a day (QID) | INTRAMUSCULAR | Status: DC | PRN

## 2013-10-07 MED ORDER — DOCUSATE SODIUM 100 MG PO CAPS
100.0000 mg | ORAL_CAPSULE | Freq: Every day | ORAL | Status: DC
  Administered 2013-10-08: 100 mg via ORAL
  Filled 2013-10-07: qty 1

## 2013-10-07 MED ORDER — AMLODIPINE BESYLATE 2.5 MG PO TABS
2.5000 mg | ORAL_TABLET | Freq: Every day | ORAL | Status: DC
  Filled 2013-10-07: qty 1

## 2013-10-07 MED ORDER — MORPHINE SULFATE 2 MG/ML IJ SOLN: 2.0000 mg | INTRAMUSCULAR | Status: DC | PRN

## 2013-10-07 MED ORDER — HEPARIN SODIUM (PORCINE) 5000 UNIT/ML IJ SOLN
5000.0000 [IU] | Freq: Three times a day (TID) | INTRAMUSCULAR | Status: DC
  Administered 2013-10-07 – 2013-10-08 (×2): 5000 [IU] via SUBCUTANEOUS
  Filled 2013-10-07 (×3): qty 1

## 2013-10-07 MED ORDER — ACETAMINOPHEN 325 MG PO TABS
650.0000 mg | ORAL_TABLET | ORAL | Status: DC | PRN
  Administered 2013-10-08: 650 mg via ORAL
  Filled 2013-10-07: qty 2

## 2013-10-07 MED ORDER — MORPHINE SULFATE 4 MG/ML IJ SOLN
4.0000 mg | Freq: Once | INTRAMUSCULAR | Status: AC
  Administered 2013-10-07: 4 mg via INTRAVENOUS
  Filled 2013-10-07: qty 1

## 2013-10-07 MED ORDER — SIMVASTATIN 10 MG PO TABS
10.0000 mg | ORAL_TABLET | Freq: Every day | ORAL | Status: DC
  Filled 2013-10-07: qty 1

## 2013-10-07 MED ORDER — LISINOPRIL 10 MG PO TABS
10.0000 mg | ORAL_TABLET | Freq: Every day | ORAL | Status: DC
  Filled 2013-10-07: qty 1

## 2013-10-07 MED ORDER — ONDANSETRON HCL 4 MG/2ML IJ SOLN
4.0000 mg | Freq: Once | INTRAMUSCULAR | Status: AC
  Administered 2013-10-07: 4 mg via INTRAVENOUS
  Filled 2013-10-07: qty 2

## 2013-10-07 MED ORDER — SODIUM CHLORIDE 0.9 % IV BOLUS (SEPSIS)
500.0000 mL | Freq: Once | INTRAVENOUS | Status: AC
  Administered 2013-10-07: 500 mL via INTRAVENOUS

## 2013-10-07 MED ORDER — FENOFIBRATE 54 MG PO TABS
54.0000 mg | ORAL_TABLET | Freq: Every day | ORAL | Status: DC
  Administered 2013-10-08: 54 mg via ORAL
  Filled 2013-10-07: qty 1

## 2013-10-07 NOTE — H&P (Signed)
[facility] Ad[facility] History and Physical Service Pager: [phone]  Patient name: [name] Medical record number: [id number] Date of birth: 06-06-1970 Age: 43 y.o. Gender: male  Primary Care Provider: Burtis JunesBLOUNT,ALVIN VINCENT, MD Consultants: None Code Status: Full code, confirmed with patient on admission  Chief Complaint: Chest Pain  Assessment and Plan: [name] is a 43 y.o. male presenting with atypical L sided chest pain . PMH is significant for HTN, HLD, T2DM, GERD, and schizoprenia.   # Chest Pain: Most likely MSK, as pain is reproducible on palpation of chest.  Chest pain is left-sided, comes on with activity, but is not resolved with rest or nitroglycerin, so it does not sound like typical angina.  No known cardiac history; however, this patient has many cardiac RFs, including HTN, HLD, T2DM, and family history of MI in father at age 45.  [facility], but no records are available for this.  He is not currently followed by a cardiologist. Initial troponin negative, ECG unchanged, and CXR clear. - Admit to tele bed for obs O/N, vitals per unit - Cycle troponins x2 - Repeat ECG in AM - Risk stratification labs : Lipid panel, Hgb A1c, TSH in AM - Nitro prn and morphine 2mg  IV q4h prn for CP - Protonix for GERD - Consider outpatient cardiology f/u, as patient does have many RFs for cardiac disease and may benefit from stress test or echocardiogram as outpt.  # Mild Normocytic Anemia: Hgb 12.9 on admission, previously 14-15. - Continue to monitor.  # HTN: BP well controlled. Will continue to monitor. - Continue home regimen: Norvasc, Lisinopril  # HLD: Lipid panel in AM as above , last one in 2010. - Continue home fenofibrate and statin (simvastatin substituted for pravastatin). - Calculate ASCVD 10 yr risk with lipid panel in AM and consider switching to high-intensity statin.  # CKD: stage 2  Creatinine at baseline of 1.2 on admission. - Continue to monitor.  #T2DM:  No Hgb A1c on file.  Will check as above. - Continue home metformin 500mg  BID. - Will monitor CBGs.  #Schizophrenia and Depression/Anxiety: Stable.  Only on once monthly injection of abilify, received today.  Will monitor.  FEN/GI: Carb-modified diet, KVO Prophylaxis: SQ Heparin  Disposition: Pending ACS r/o.  History of Present Illness: [name] is a 43 y.o. male presenting with chest pain. PMH is significant for HTN, HLD, T2DM, GERD, and schizoprenia.  He was in his usual state of health until he was [place] [facility] to his house this afternoon after getting his shot of Abilify for Schizophrenia and had sudden onset of sharp, stabbing CP.  His pain was located on the left chest with radiation to his L shoulder.  It was associated with SOB, nausea and dizziness.  He denies any diaphoresis, PND, orthopnea.  He reports HA earlier in the day, not associated with CP.  Pt reports occasional LE edema, usually when standing for long periods of time.  He denies any recent injury or straining while lifting heavy objects.  In ambulance, patient received ASA 325mg  and SL Nitro x1.  Pain improved from 10/10 pain to 8/10 pain.  Patient still experiencing pain while at rest.  [facility], but pt reports having a "normal" cardiac [place] [place] [facility]   Review Of Systems: Per HPI with the following additions: No weakness, numbness, constipation.  Diarrhea x1 today. Otherwise 12 point review of systems was performed and was unremarkable.  Patient Active Problem List   Diagnosis Date Noted  . Esophageal reflux 04/02/2012  . Hemorrhage of rectum and anus 04/02/2012  . Schizophrenia   . Depression    Past Medical History: Past Medical History  Diagnosis Date  . Schizophrenia   . Depression   . Hypertension   . Hypercholesteremia   . GERD  (gastroesophageal reflux disease)   . DM (diabetes mellitus)   . Allergy   . Anxiety   . Chronic kidney disease     kidney stones removed x1   Past Surgical History: Past Surgical History  Procedure Laterality Date  . Circumcision    . Lithotripsy      kidney stone crushed x1   Social History: History  Substance Use Topics  . Smoking status: Former Games developer  . Smokeless tobacco: Never Used  . Alcohol Use: No   Additional social history: Used to smoke socially, quit in 2008.  Drinks wine coolers socially, 2-3 only occasionally while out with friends.  Denies illicit drug use.  Please also refer to relevant sections of EMR.  Family History: Family History  Problem Relation Age of Onset  . Colon cancer Neg Hx   . Esophageal cancer Neg Hx   . Rectal cancer Neg Hx   . Stomach cancer Neg Hx    Allergies and Medications: No Known Allergies No current facility-administered medications on file prior to encounter.   Current Outpatient Prescriptions on File Prior to Encounter  Medication Sig Dispense Refill  . amLODipine (NORVASC) 2.5 MG tablet Take 2.5 mg by mouth daily.      . ARIPiprazole (ABILIFY) 9.75 MG/1.3ML injection Inject 9.75 mg into the muscle every 30 (thirty) days.      Marland Kitchen docusate sodium (COLACE) 100 MG capsule Take 100 mg by mouth daily.       . fenofibrate (TRICOR) 48 MG tablet Take 48 mg by mouth daily.       . metFORMIN (GLUCOPHAGE) 500 MG tablet Take 500 mg by mouth 2 (two) times daily with a meal.      . omeprazole (PRILOSEC) 20 MG capsule Take 20 mg by mouth daily.      . pravastatin (PRAVACHOL) 20 MG tablet Take 20 mg by mouth daily.        Objective: BP 106/71  Pulse 75  Temp(Src) 98.1 F (36.7 C) (Oral)  Resp 18  SpO2 100% Exam: General: Pleasant, alert, cooperative AA man sitting in bed in NAD HEENT: MMM, EOMI Cardiovascular: RRR, no m/r/g.  Chest pain reproducible with palpation of sternum and left chest. Respiratory: CTAB, no w/r/c. Normal  WOB Abdomen: Soft, NT, ND. NABS. No HSM Extremities: No cyanosis, clubbing, edema. Skin: No rashes or breakdown Neuro: CN 2-12 intact. No focal deficits.  Labs and Imaging: CBC BMET   Recent Labs Lab 10/07/13 1623  WBC 8.1  HGB 12.9*  HCT 39.6  PLT 233    Recent Labs Lab 10/07/13 1623  NA 147  K 3.8  CL 110  CO2 26  BUN 18  CREATININE 1.20  GLUCOSE 88  CALCIUM 8.4      ECG (7/15): NSR, low voltage, unchanged from previous ECGs  CXR (7/15): No acute abnormalities.  Troponin negative x1    Shirlee Latch, MD 10/07/2013, 7:35 PM PGY-1, [facility] FPTS Intern pager: [phone], text pages welcome  I have read and agree with the amended note as above.  My revisions are noted in [facility]   Wenda Low, MD, PGY-2 11:56 PM

## 2013-10-07 NOTE — ED Notes (Signed)
Pt laughing and talking on phone.

## 2013-10-07 NOTE — ED Notes (Signed)
Per EMS- pt was walking about 1 block when he began having left chest pain that radiated between shoulder blades. Pt also has SOB. Pt has had chest pain in the past and was seen for it with no diagnosis reported. Pt received 324mg  asprin, 1 nitroglycerin and 4mg  of zofran

## 2013-10-07 NOTE — ED Provider Notes (Signed)
CSN: [id number]     Arrival date & time 10/07/13  1543 History   First MD Initiated Contact with Patient 10/07/13 1554     Chief Complaint  Patient presents with  . Chest Pain     (Consider location/radiation/quality/duration/timing/severity/associated sxs/prior Treatment) The history is provided by the patient. No language interpreter was used.  [name] is a 43 y/o M with PMhx of schizophrenia, depression, HTN, GERD, anxiety, HCL, obesity, GERD presenting to the ED with chest pain that started this afternoon. Patient reported that he was waiting in line for medications to be refilled at [facility] when suddenly he started to develop chest pain to the left side of his chest described as a stabbing pain that radiated down his left arm. Stated that the pain was sudden. Reported that he became light-headed and needed to sit down. Reported that he became nauseous, but did not have an episode of emesis. Stated that with the intensity of pain he had shortness of breath that has now resolved. Patient has family history of cardiac issues with father having a MI when he was 48 years of age. Reported that normally has heartburn symptoms, but this pain is different. En route to the ED, by EMS patient reported that he was given ASA 324 mg and Nitroglycerin that dulled down the pain. Denied fever, diaphoresis, syncope, difficulty breathing, numbness, tingling, weakness, long travels, hemostasis, leg swelling, vomiting, abdominal pain. PCP Dr. Bruna Potter  Past Medical History  Diagnosis Date  . Schizophrenia   . Depression   . Hypertension   . Hypercholesteremia   . GERD (gastroesophageal reflux disease)   . DM (diabetes mellitus)   . Allergy   . Anxiety   . Chronic kidney disease     kidney stones removed x1   Past Surgical History  Procedure Laterality Date  . Circumcision    . Lithotripsy      kidney stone crushed x1   Family History  Problem Relation Age of Onset  . Colon cancer Neg Hx   .  Esophageal cancer Neg Hx   . Rectal cancer Neg Hx   . Stomach cancer Neg Hx    History  Substance Use Topics  . Smoking status: Former Games developer  . Smokeless tobacco: Never Used  . Alcohol Use: No    Review of Systems  Constitutional: Negative for fever and chills.  Eyes: Negative for visual disturbance.  Respiratory: Positive for chest tightness.   Cardiovascular: Positive for chest pain. Negative for leg swelling.  Gastrointestinal: Positive for nausea. Negative for vomiting, abdominal pain, diarrhea, constipation and blood in stool.  Musculoskeletal: Negative for neck pain.  Neurological: Positive for light-headedness. Negative for weakness.      Allergies  Review of patient's allergies indicates no known allergies.  Home Medications   Prior to Admission medications   Medication Sig Start Date End Date Taking? Authorizing Provider  amLODipine (NORVASC) 2.5 MG tablet Take 2.5 mg by mouth daily.   Yes Historical Provider, MD  ARIPiprazole (ABILIFY) 9.75 MG/1.3ML injection Inject 9.75 mg into the muscle every 30 (thirty) days.   Yes Historical Provider, MD  docusate sodium (COLACE) 100 MG capsule Take 100 mg by mouth daily.    Yes Historical Provider, MD  fenofibrate (TRICOR) 48 MG tablet Take 48 mg by mouth daily.  03/26/12  Yes Historical Provider, MD  lisinopril (PRINIVIL,ZESTRIL) 10 MG tablet Take 10 mg by mouth daily.   Yes Historical Provider, MD  metFORMIN (GLUCOPHAGE) 500 MG tablet Take 500 mg  by mouth 2 (two) times daily with a meal.   Yes Historical Provider, MD  omeprazole (PRILOSEC) 20 MG capsule Take 20 mg by mouth daily.   Yes Historical Provider, MD  pravastatin (PRAVACHOL) 20 MG tablet Take 20 mg by mouth daily.   Yes Historical Provider, MD   BP 116/71  Pulse 57  Temp(Src) 98.1 F (36.7 C) (Oral)  Resp 21  SpO2 92% Physical Exam  Nursing note and vitals reviewed. Constitutional: He is oriented to person, place, and time. He appears well-developed and  well-nourished. No distress.  HENT:  Head: Normocephalic and atraumatic.  Mouth/Throat: Oropharynx is clear and moist. No oropharyngeal exudate.  Eyes: Conjunctivae and EOM are normal. Pupils are equal, round, and reactive to light. Right eye exhibits no discharge. Left eye exhibits no discharge.  Neck: Normal range of motion. Neck supple. No tracheal deviation present.  Cardiovascular: Normal rate, regular rhythm and normal heart sounds.  Exam reveals no friction rub.   No murmur heard. Cap refill < 3 seconds Negative swelling or pitting edema noted to the lower extremities bilaterally  Pulmonary/Chest: Effort normal and breath sounds normal. No respiratory distress. He has no wheezes. He has no rales. He exhibits no tenderness.  Patient is able to speak in full sentences without difficulty  Negative use of accessory muscles Negative stridor  Abdominal:  Obese  Musculoskeletal: Normal range of motion.  Full ROM to upper and lower extremities without difficulty noted, negative ataxia noted.  Lymphadenopathy:    He has no cervical adenopathy.  Neurological: He is alert and oriented to person, place, and time. No cranial nerve deficit. He exhibits normal muscle tone. Coordination normal.  Cranial nerves III-XII grossly intact Strength 5+/5+ to upper and lower extremities bilaterally with resistance applied, equal distribution noted Equal grip strength  Negative facial drooping  Negative slurred speech  Negative aphasia  Skin: Skin is warm and dry. No rash noted. He is not diaphoretic. No erythema.  Psychiatric: He has a normal mood and affect. His behavior is normal. Thought content normal.    ED Course  Procedures (including critical care time)  7:14 PM This provider spoke with [facility] - discussed case, history, labs, imaging, vitals in great detail. Patient to be admitted to the hospital for cardiac rule out.  Results for orders placed during the hospital  encounter of 10/07/13  CBC WITH DIFFERENTIAL      Result Value Ref Range   WBC 8.1  4.0 - 10.5 K/uL   RBC 4.60  4.22 - 5.81 MIL/uL   Hemoglobin 12.9 (*) 13.0 - 17.0 g/dL   HCT 78.2  95.6 - 21.3 %   MCV 86.1  78.0 - 100.0 fL   MCH 28.0  26.0 - 34.0 pg   MCHC 32.6  30.0 - 36.0 g/dL   RDW 08.6  57.8 - 46.9 %   Platelets 233  150 - 400 K/uL   Neutrophils Relative % 57  43 - 77 %   Neutro Abs 4.7  1.7 - 7.7 K/uL   Lymphocytes Relative 38  12 - 46 %   Lymphs Abs 3.1  0.7 - 4.0 K/uL   Monocytes Relative 4  3 - 12 %   Monocytes Absolute 0.3  0.1 - 1.0 K/uL   Eosinophils Relative 1  0 - 5 %   Eosinophils Absolute 0.1  0.0 - 0.7 K/uL   Basophils Relative 0  0 - 1 %   Basophils Absolute 0.0  0.0 - 0.1  K/uL  COMPREHENSIVE METABOLIC PANEL      Result Value Ref Range   Sodium 147  137 - 147 mEq/L   Potassium 3.8  3.7 - 5.3 mEq/L   Chloride 110  96 - 112 mEq/L   CO2 26  19 - 32 mEq/L   Glucose, Bld 88  70 - 99 mg/dL   BUN 18  6 - 23 mg/dL   Creatinine, Ser 1.191.20  0.50 - 1.35 mg/dL   Calcium 8.4  8.4 - 14.710.5 mg/dL   Total Protein 6.2  6.0 - 8.3 g/dL   Albumin 3.4 (*) 3.5 - 5.2 g/dL   AST 23  0 - 37 U/L   ALT 28  0 - 53 U/L   Alkaline Phosphatase 76  39 - 117 U/L   Total Bilirubin 0.3  0.3 - 1.2 mg/dL   GFR calc non Af Amer 72 (*) >90 mL/min   GFR calc Af Amer 83 (*) >90 mL/min   Anion gap 11  5 - 15  TROPONIN I      Result Value Ref Range   Troponin I <0.30  <0.30 ng/mL    Labs Review Labs Reviewed  CBC WITH DIFFERENTIAL - Abnormal; Notable for the following:    Hemoglobin 12.9 (*)    All other components within normal limits  COMPREHENSIVE METABOLIC PANEL - Abnormal; Notable for the following:    Albumin 3.4 (*)    GFR calc non Af Amer 72 (*)    GFR calc Af Amer 83 (*)    All other components within normal limits  TROPONIN I    Imaging Review Dg Chest 2 View  10/07/2013   CLINICAL DATA:  LEFT side chest pain, shortness of breath, and dizziness for 1 day, history  hypertension, hypercholesterolemia, GERD, diabetes  EXAM: CHEST  2 VIEW  COMPARISON:  09/29/2013  FINDINGS: Normal heart size, mediastinal contours, and pulmonary vascularity.  Numerous EKG leads project over chest.  Lungs clear.  No infiltrate, pleural effusion or pneumothorax.  Bones unremarkable.  IMPRESSION: No acute abnormalities.   Electronically Signed   By: Ulyses SouthwardMark  Boles M.D.   On: 10/07/2013 18:55     EKG Interpretation   Date/Time:  Wednesday October 07 2013 16:14:42 EDT Ventricular Rate:  66 PR Interval:  160 QRS Duration: 87 QT Interval:  426 QTC Calculation: 446 R Axis:   24 Text Interpretation:  Sinus rhythm Low voltage, precordial leads Abnormal  R-wave progression, early transition When compared with ECG of 12/16/2009  No significant change was found Confirmed by [facility]  MD, Nicholos JohnsKATHLEEN  [phone]([id number]) on 10/07/2013 4:25:44 PM      MDM   Final diagnoses:  Chest pain, unspecified chest pain type  Other secondary hypertension  Other specified diabetes mellitus with unspecified complications    Medications  morphine 4 MG/ML injection 4 mg (not administered)  ondansetron (ZOFRAN) injection 4 mg (not administered)  sodium chloride 0.9 % bolus 500 mL (500 mLs Intravenous New Bag/Given 10/07/13 1922)    Filed Vitals:   10/07/13 1615 10/07/13 1700 10/07/13 1835 10/07/13 1915  BP: 102/80 112/59 106/71 116/71  Pulse: 67 75  57  Temp:      TempSrc:      Resp: 21 25 18 21   SpO2: 96% 99% 100% 92%   EKG noted sinus rhythm with a heart rate of 66 beats per minute-R-wave progression when compared with EKG from 12/16/2009-acute changes. Troponin negative elevation. CBC negative elevated white blood cell count left shift. CMP negative  findings-ALT, AST, alkaline phosphatase and bilirubin negative elevation. BUN 18, Creatinine 1.20. Chest x-ray negative for acute cardiac pulmonary disease. Patient has a HEART score of 3 and family history of cardiac issues. Patient has high risk factors.  Patient to be admitted for cardiac rule out. This provider spoke with Family medicine who will admit patient for cardia rule out. Patient understood and agreed to plan of care. Patient stable for transfer.   Raymon Mutton, PA-C 10/08/13 0246

## 2013-10-08 DIAGNOSIS — R0789 Other chest pain: Secondary | ICD-10-CM | POA: Diagnosis not present

## 2013-10-08 DIAGNOSIS — R079 Chest pain, unspecified: Secondary | ICD-10-CM

## 2013-10-08 DIAGNOSIS — F209 Schizophrenia, unspecified: Secondary | ICD-10-CM

## 2013-10-08 DIAGNOSIS — E118 Type 2 diabetes mellitus with unspecified complications: Secondary | ICD-10-CM | POA: Diagnosis not present

## 2013-10-08 DIAGNOSIS — F3289 Other specified depressive episodes: Secondary | ICD-10-CM | POA: Diagnosis not present

## 2013-10-08 DIAGNOSIS — I158 Other secondary hypertension: Secondary | ICD-10-CM | POA: Diagnosis not present

## 2013-10-08 LAB — CBC
HEMATOCRIT: 40.2 % (ref 39.0–52.0)
Hemoglobin: 12.9 g/dL — ABNORMAL LOW (ref 13.0–17.0)
MCH: 28.2 pg (ref 26.0–34.0)
MCHC: 32.1 g/dL (ref 30.0–36.0)
MCV: 87.8 fL (ref 78.0–100.0)
Platelets: 224 10*3/uL (ref 150–400)
RBC: 4.58 MIL/uL (ref 4.22–5.81)
RDW: 13.6 % (ref 11.5–15.5)
WBC: 7 10*3/uL (ref 4.0–10.5)

## 2013-10-08 LAB — HEMOGLOBIN A1C
Hgb A1c MFr Bld: 6.4 % — ABNORMAL HIGH (ref <5.7)
Mean Plasma Glucose: 137 mg/dL — ABNORMAL HIGH (ref <117)

## 2013-10-08 LAB — BASIC METABOLIC PANEL
Anion gap: 13 (ref 5–15)
BUN: 15 mg/dL (ref 6–23)
CHLORIDE: 107 meq/L (ref 96–112)
CO2: 25 mEq/L (ref 19–32)
Calcium: 8.3 mg/dL — ABNORMAL LOW (ref 8.4–10.5)
Creatinine, Ser: 1.25 mg/dL (ref 0.50–1.35)
GFR calc Af Amer: 79 mL/min — ABNORMAL LOW (ref >90)
GFR calc non Af Amer: 68 mL/min — ABNORMAL LOW (ref >90)
GLUCOSE: 89 mg/dL (ref 70–99)
POTASSIUM: 3.9 meq/L (ref 3.7–5.3)
Sodium: 145 mEq/L (ref 137–147)

## 2013-10-08 LAB — TROPONIN I: Troponin I: <0.3 ng/mL (ref <0.30)

## 2013-10-08 LAB — GLUCOSE, CAPILLARY
Glucose-Capillary: 85 mg/dL (ref 70–99)
Glucose-Capillary: 111 mg/dL — ABNORMAL HIGH (ref 70–99)

## 2013-10-08 LAB — TSH: TSH: 1.42 u[IU]/mL (ref 0.350–4.500)

## 2013-10-08 NOTE — Progress Notes (Signed)
Reviewed discharge instructions for patient and he stated his understanding.  Patient stated he will be taking the bus home and he felt stable enough to wait at the bus stop.  Ambulating in room independently.  Colman Caterarpley, Kea Callan Danielle

## 2013-10-08 NOTE — Discharge Instructions (Signed)
You were admitted for chest pain.  All of the tests for your heart were normal.  We think that this chest pain was likely caused by sore muscles in your chest.  Talk to your doctor about whether you should have more tests to look at your heart as an outpatient.   Chest Pain (Nonspecific) It is often hard to give a specific diagnosis for the cause of chest pain. There is always a chance that your pain could be related to something serious, such as a heart attack or a blood clot in the lungs. You need to follow up with your health care provider for further evaluation. CAUSES   Heartburn.  Pneumonia or bronchitis.  Anxiety or stress.  Inflammation around your heart (pericarditis) or lung (pleuritis or pleurisy).  A blood clot in the lung.  A collapsed lung (pneumothorax). It can develop suddenly on its own (spontaneous pneumothorax) or from trauma to the chest.  Shingles infection (herpes zoster virus). The chest wall is composed of bones, muscles, and cartilage. Any of these can be the source of the pain.  The bones can be bruised by injury.  The muscles or cartilage can be strained by coughing or overwork.  The cartilage can be affected by inflammation and become sore (costochondritis). DIAGNOSIS  Lab tests or other studies may be needed to find the cause of your pain. Your health care provider may have you take a test called an ambulatory electrocardiogram (ECG). An ECG records your heartbeat patterns over a 24-hour period. You may also have other tests, such as:  Transthoracic echocardiogram (TTE). During echocardiography, sound waves are used to evaluate how blood flows through your heart.  Transesophageal echocardiogram (TEE).  Cardiac monitoring. This allows your health care provider to monitor your heart rate and rhythm in real time.  Holter monitor. This is a portable device that records your heartbeat and can help diagnose heart arrhythmias. It allows your health care  provider to track your heart activity for several days, if needed.  Stress tests by exercise or by giving medicine that makes the heart beat faster. TREATMENT   Treatment depends on what may be causing your chest pain. Treatment may include:  Acid blockers for heartburn.  Anti-inflammatory medicine.  Pain medicine for inflammatory conditions.  Antibiotics if an infection is present.  You may be advised to change lifestyle habits. This includes stopping smoking and avoiding alcohol, caffeine, and chocolate.  You may be advised to keep your head raised (elevated) when sleeping. This reduces the chance of acid going backward from your stomach into your esophagus. Most of the time, nonspecific chest pain will improve within 2-3 days with rest and mild pain medicine.  HOME CARE INSTRUCTIONS   If antibiotics were prescribed, take them as directed. Finish them even if you start to feel better.  For the next few days, avoid physical activities that bring on chest pain. Continue physical activities as directed.  Do not use any tobacco products, including cigarettes, chewing tobacco, or electronic cigarettes.  Avoid drinking alcohol.  Only take medicine as directed by your health care provider.  Follow your health care provider's suggestions for further testing if your chest pain does not go away.  Keep any follow-up appointments you made. If you do not go to an appointment, you could develop lasting (chronic) problems with pain. If there is any problem keeping an appointment, call to reschedule. SEEK MEDICAL CARE IF:   Your chest pain does not go away, even after treatment.  You have a rash with blisters on your chest.  You have a fever. SEEK IMMEDIATE MEDICAL CARE IF:   You have increased chest pain or pain that spreads to your arm, neck, jaw, back, or abdomen.  You have shortness of breath.  You have an increasing cough, or you cough up blood.  You have severe back or  abdominal pain.  You feel nauseous or vomit.  You have severe weakness.  You faint.  You have chills. This is an emergency. Do not wait to see if the pain will go away. Get medical help at once. Call your local emergency services (911 in [place] Do not drive yourself to the hospital. MAKE SURE YOU:   Understand these instructions.  Will watch your condition.  Will get help right away if you are not doing well or get worse. Document Released: 12/20/2004 Document Revised: 03/17/2013 Document Reviewed: 10/16/2007 [facility] Patient Information 2015 [place], [place]. This information is not intended to replace advice given to you by your health care provider. Make sure you discuss any questions you have with your health care provider.

## 2013-10-08 NOTE — H&P (Signed)
Call Pager [phone] for any questions or notifications regarding this patient  FMTS Attending Admission Note: Denny LevySara Octavious Zidek MD Attending pager:[phone] I  have seen and examined this patient, reviewed their chart. I have discussed this patient with the resident. I agree with the resident's findings, assessment and care plan. Chest pain free at the time of my exam.

## 2013-10-08 NOTE — ED Provider Notes (Signed)
Medical screening examination/treatment/procedure(s) were performed by non-physician practitioner and as supervising physician I was immediately available for consultation/collaboration.   EKG Interpretation   Date/Time:  Wednesday October 07 2013 16:14:42 EDT Ventricular Rate:  66 PR Interval:  160 QRS Duration: 87 QT Interval:  426 QTC Calculation: 446 R Axis:   24 Text Interpretation:  Sinus rhythm Low voltage, precordial leads Abnormal  R-wave progression, early transition When compared with ECG of 12/16/2009  No significant change was found Confirmed by [facility]  MD, Ellaina Schuler  ([id number]) on 10/07/2013 4:25:44 PM        Laray AngerKathleen M Taygen Acklin, DO 10/08/13 1502

## 2013-10-08 NOTE — Progress Notes (Signed)
Utilization review completed.  

## 2013-10-08 NOTE — Discharge Summary (Signed)
Family Medicine Teaching [facility] Discharge Summary  Patient name: [name] Medical record number: [id number] Date of birth: [birth year] Age: 43 y.o. Gender: male Date of Admission: 10/07/2013  Date of Discharge: 10/08/2013  Admitting Physician: Nestor Ramp, MD  Primary Care Provider: Burtis Junes, MD Consultants: None  Indication for Hospitalization: Chest pain  Discharge Diagnoses/Problem List:  Chest pain, most likely MSK in origin Mild normocytic anemia HTN HLD CKD stage 2 T2DM Schizophrenia Depression/Anxiety  Disposition: Home  Discharge Condition: Stable  Discharge Exam:  General: Pleasant, alert AA man laying in bed in NAD  Cardiovascular: RRR, no m/r/g. Chest pain reproducible with palpation of sternum and left chest.  Respiratory: CTAB, no w/r/c. Normal WOB  Abdomen: Soft, NT, ND. NABS. Extremities: No cyanosis, clubbing, edema.    Brief Hospital Course:   [name] is a 43 y.o. male presenting with atypical L sided chest pain . PMH is significant for HTN, HLD, T2DM, GERD, and schizoprenia.   Initial cardiac w/u, including troponins x3, ECG x2, and CXR reassuring.  Pain resolved O/N.  Likely MSK in origin as reproducible on exam. All other chronic medical conditions managed with outpateint regimens.  Issues for Follow Up:  1. Patient has many cardiac RFs (HTN, HLD, T2DM, Obesity and family history of MI at age 50 in father).  Consider outpatient cardiology referral for further w/u.  Pt may benefit from stress test or echocardiogram as outpatient. 2. Hypotensive to 90s systolic during admission.  Held home lisinopril.  Consider restarting as pt as DM after cardiac w/u (to ensure no severe CHF). 3. ASCVD 10 yr risk of 9.8%.  Only taking fenofibrate and pravastatin 20mg .  Consider switching to high-intensity statin and stopping fenofibrates as they have reactions with statins.  Also, pravastatin can interact with norvasc.  Significant Procedures:  None  Significant Labs and Imaging:   Recent Labs Lab 10/07/13 1623 10/08/13 0550  WBC 8.1 7.0  HGB 12.9* 12.9*  HCT 39.6 40.2  PLT 233 224    Recent Labs Lab 10/07/13 1623 10/08/13 0550  NA 147 145  K 3.8 3.9  CL 110 107  CO2 26 25  GLUCOSE 88 89  BUN 18 15  CREATININE 1.20 1.25  CALCIUM 8.4 8.3*  ALKPHOS 76  --   AST 23  --   ALT 28  --   ALBUMIN 3.4*  --    TSH 1.420 Hgb A1c 6.4 Lipid Panel     Component Value Date/Time   CHOL 150 10/07/2013 2322   TRIG 117 10/07/2013 2322   HDL 35* 10/07/2013 2322   CHOLHDL 4.3 10/07/2013 2322   VLDL 23 10/07/2013 2322   LDLCALC 92 10/07/2013 2322   ECG (7/15): NSR, low voltage, unchanged from previous ECGs  CXR (7/15): No acute abnormalities.  Troponin negative x3  Results/Tests Pending at Time of Discharge: None  Discharge Medications:    Medication List    STOP taking these medications       lisinopril 10 MG tablet  Commonly known as:  PRINIVIL,ZESTRIL      TAKE these medications       amLODipine 2.5 MG tablet  Commonly known as:  NORVASC  Take 2.5 mg by mouth daily.     ARIPiprazole 9.75 MG/1.3ML injection  Commonly known as:  ABILIFY  Inject 9.75 mg into the muscle every 30 (thirty) days.     docusate sodium 100 MG capsule  Commonly known as:  COLACE  Take 100 mg by mouth daily.  fenofibrate 48 MG tablet  Commonly known as:  TRICOR  Take 48 mg by mouth daily.     metFORMIN 500 MG tablet  Commonly known as:  GLUCOPHAGE  Take 500 mg by mouth 2 (two) times daily with a meal.     omeprazole 20 MG capsule  Commonly known as:  PRILOSEC  Take 20 mg by mouth daily.     pravastatin 20 MG tablet  Commonly known as:  PRAVACHOL  Take 20 mg by mouth daily.        Discharge Instructions: Please refer to Patient Instructions section of EMR for full details.  Patient was counseled important signs and symptoms that should prompt return to medical care, changes in medications, dietary instructions,  activity restrictions, and follow up appointments.   Follow-Up Appointments: Follow-up Information   Follow up with Burtis JunesBLOUNT,ALVIN VINCENT, MD. Schedule an appointment as soon as possible for a visit in 1 week. (For hospital follow-up)    Specialty:  Family Medicine   Contact information:   [address] [address] [place] [address] [phone]       Shirlee LatchAngela Bacigalupo, MD 10/08/2013, 2:06 PM PGY-1, [facility]

## 2015-05-27 IMAGING — CR DG CHEST 2V
2 series · 2 of 2 positions shown · non-contrast
Comparison: Chest radiograph May 16, 2010 and CT of the chest
March 11, 2009

CLINICAL DATA: Left arm cramping, pain.

EXAM:
CHEST  2 VIEW

[w chest pa]
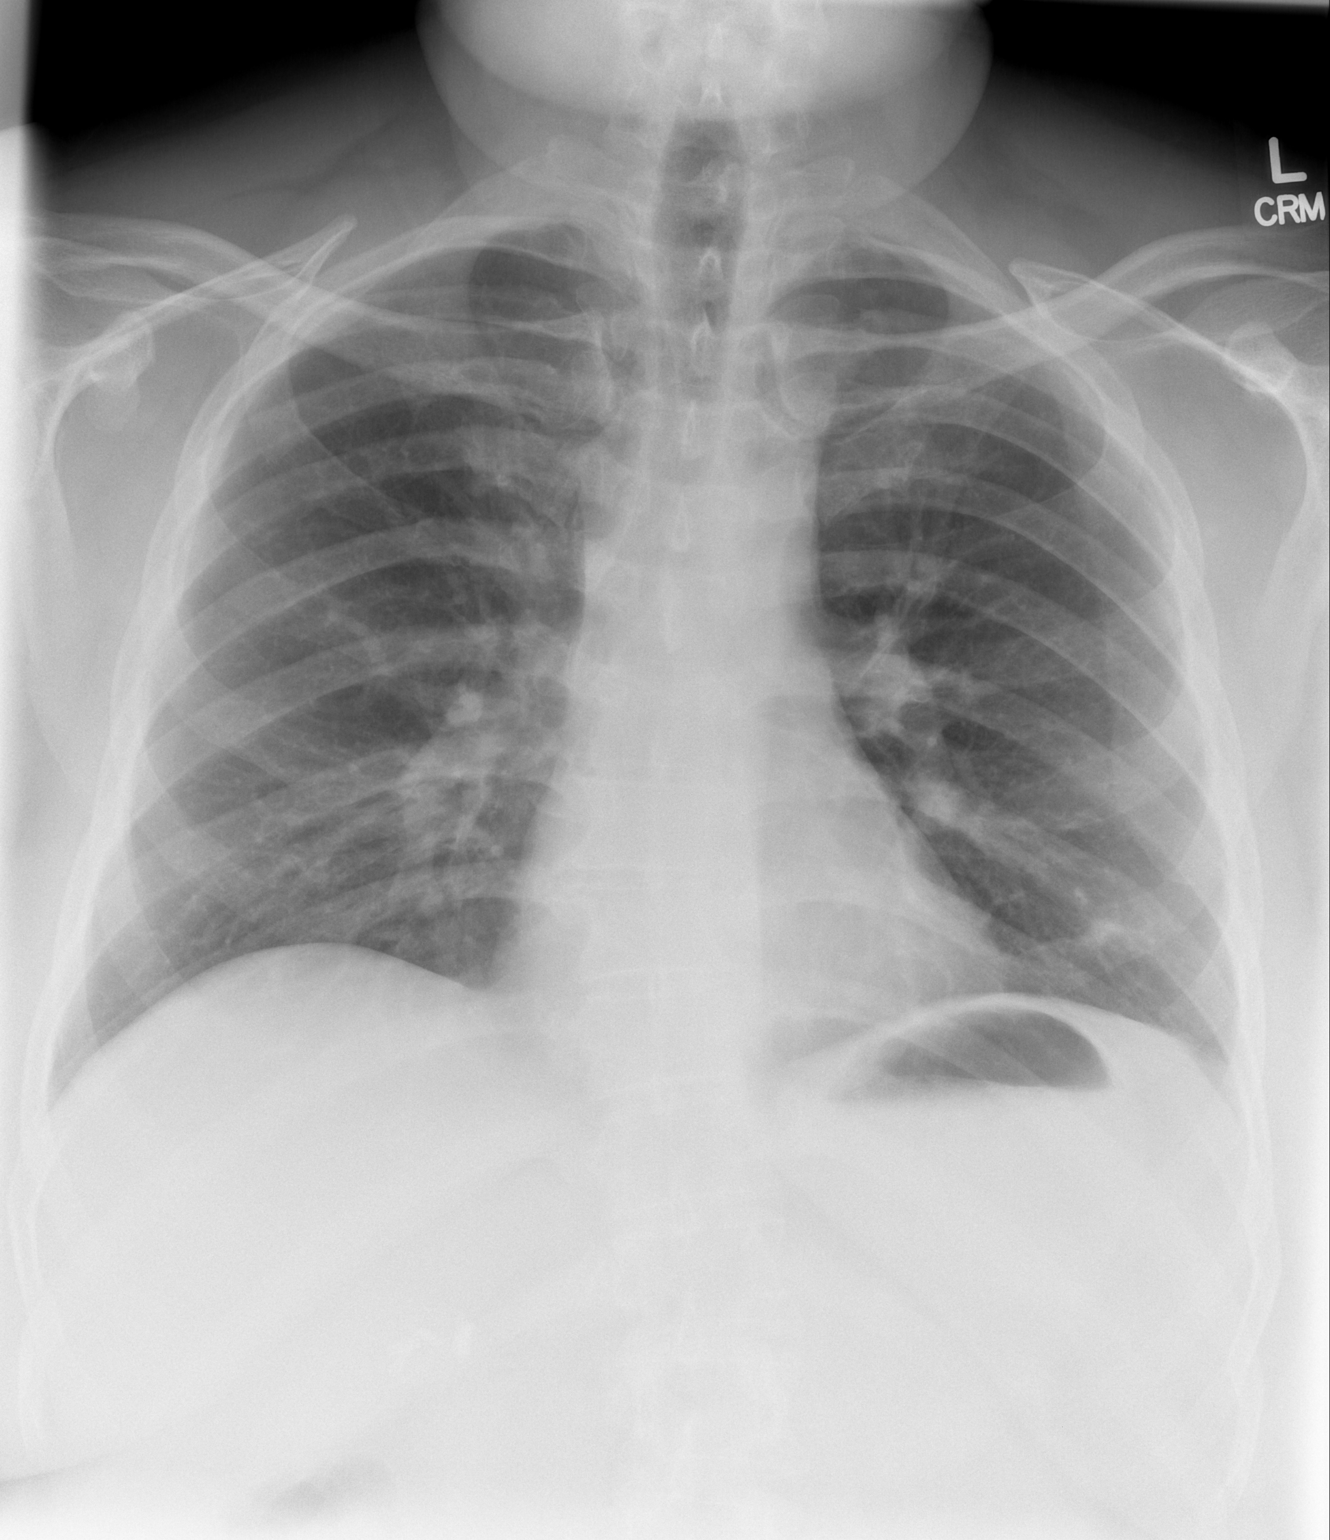

[w chest lat]
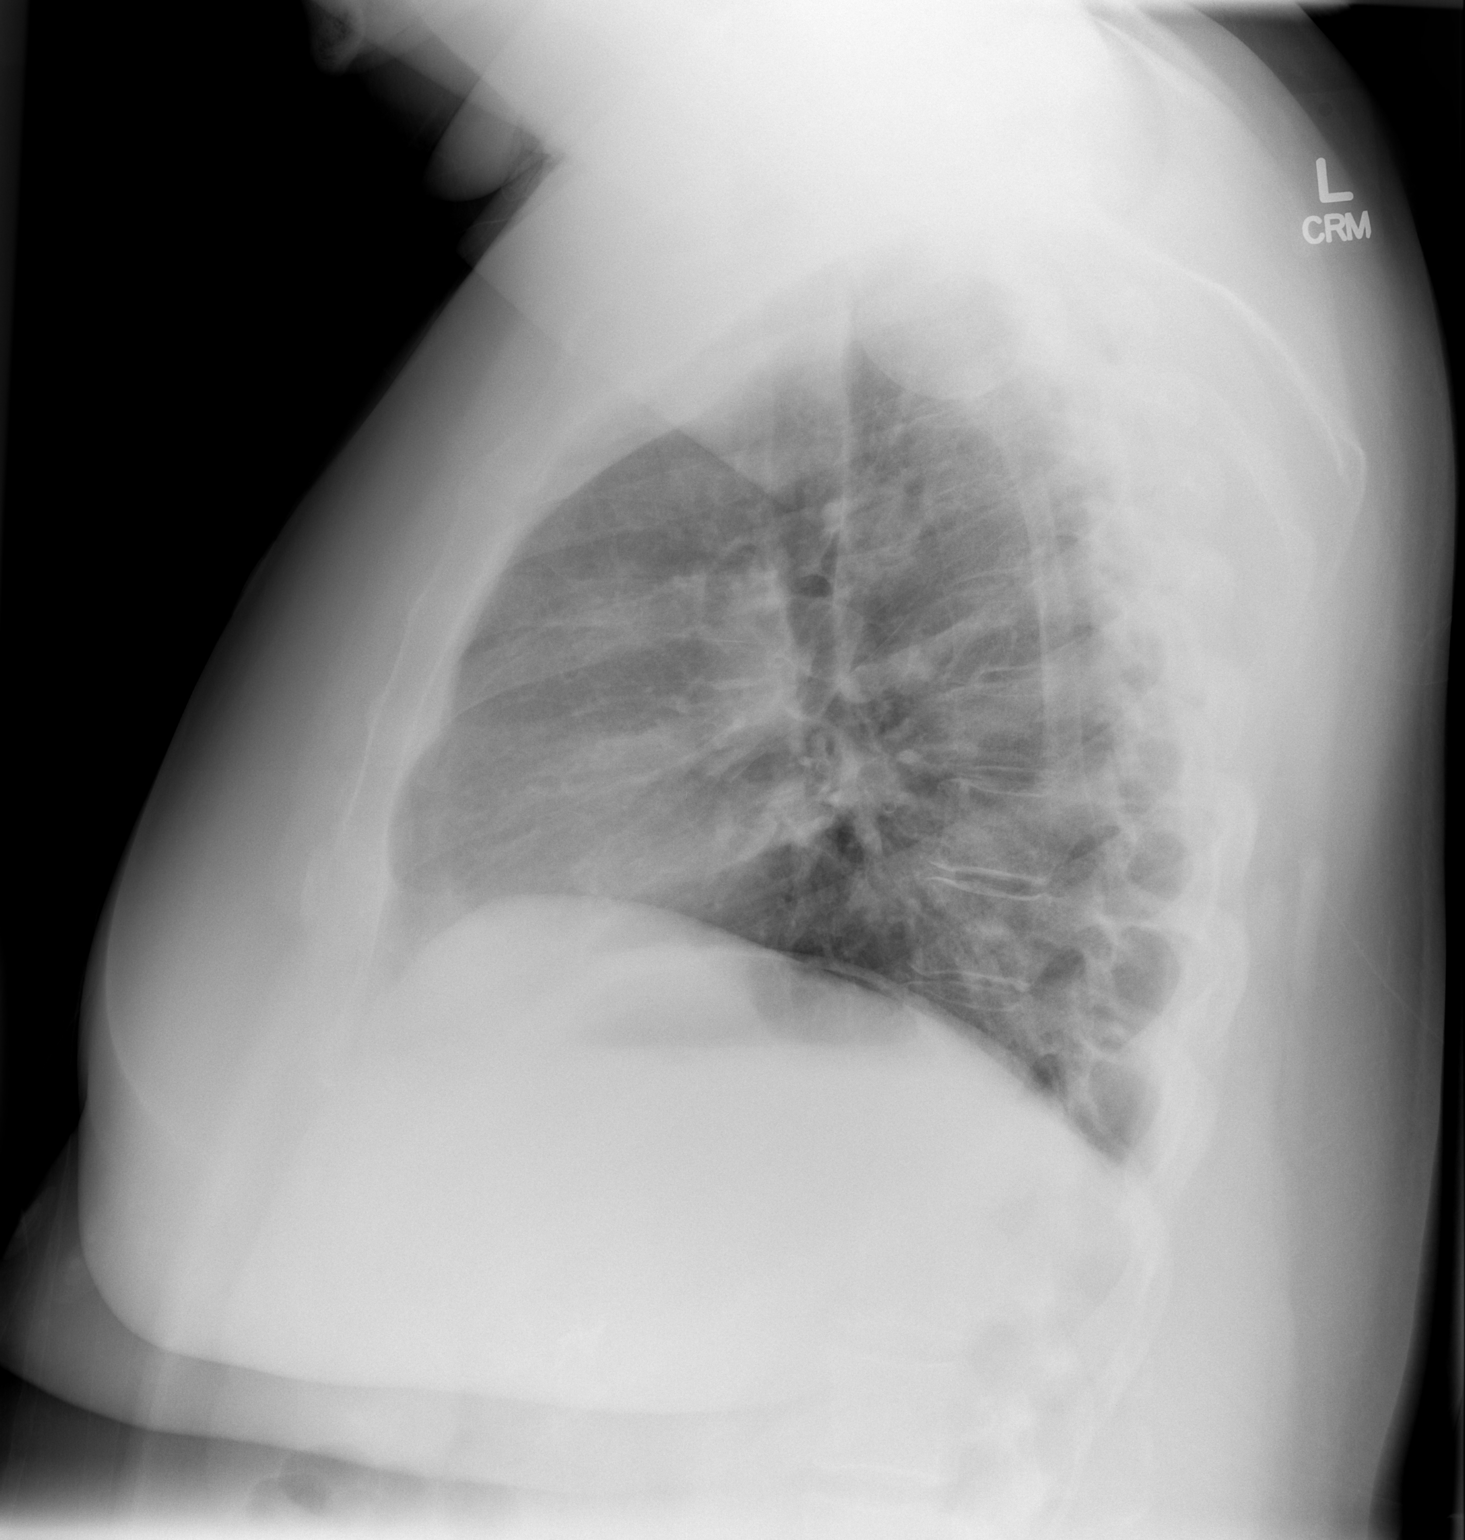

[2 of 2 positions shown; findings below may reference images not displayed]

FINDINGS: Cardiomediastinal silhouette is unremarkable. Subcentimeter nodular
density in left lung base seen only on frontal radiograph, not
definitely present on prior examination. The lungs are clear without
pleural effusions or focal consolidations. Trachea projects midline
and there is no pneumothorax. Soft tissue planes and included
osseous structures are non-suspicious. Surgical clips in the abdomen
likely reflect cholecystectomy.
IMPRESSION: No acute cardiopulmonary process.

Nodular density in left lung base seen only on the frontal
radiograph, which could be parenchymal or related to the anterior
fifth rib, oblique views could be obtained if clinically indicated
versus followup CT of the chest.

  By: Cloc Turin

## 2024-01-25 DEATH — deceased
